# Patient Record
Sex: Female | Born: 1935 | Race: White | Hispanic: No | Smoking: Never smoker
Health system: Southern US, Community
[De-identification: ages and names within clinical notes are randomized; demographics above are authoritative.]

## PROBLEM LIST (undated history)

## (undated) DIAGNOSIS — M1990 Unspecified osteoarthritis, unspecified site: Secondary | ICD-10-CM

## (undated) DIAGNOSIS — F0390 Unspecified dementia without behavioral disturbance: Secondary | ICD-10-CM

## (undated) DIAGNOSIS — I509 Heart failure, unspecified: Secondary | ICD-10-CM

## (undated) DIAGNOSIS — E785 Hyperlipidemia, unspecified: Secondary | ICD-10-CM

## (undated) DIAGNOSIS — I10 Essential (primary) hypertension: Secondary | ICD-10-CM

## (undated) DIAGNOSIS — N189 Chronic kidney disease, unspecified: Secondary | ICD-10-CM

## (undated) DIAGNOSIS — J984 Other disorders of lung: Secondary | ICD-10-CM

## (undated) HISTORY — DX: Other disorders of lung: J98.4

## (undated) HISTORY — PX: PACEMAKER INSERTION: SHX728

## (undated) HISTORY — DX: Chronic kidney disease, unspecified: N18.9

## (undated) HISTORY — PX: BREAST SURGERY: SHX581

## (undated) HISTORY — DX: Essential (primary) hypertension: I10

## (undated) HISTORY — DX: Hyperlipidemia, unspecified: E78.5

## (undated) HISTORY — DX: Unspecified dementia, unspecified severity, without behavioral disturbance, psychotic disturbance, mood disturbance, and anxiety: F03.90

## (undated) HISTORY — DX: Heart failure, unspecified: I50.9

## (undated) HISTORY — DX: Unspecified osteoarthritis, unspecified site: M19.90

## (undated) HISTORY — PX: ABDOMINAL HYSTERECTOMY: SHX81

## (undated) HISTORY — PX: CATARACT EXTRACTION: SUR2

---

## 2004-02-24 ENCOUNTER — Other Ambulatory Visit: Payer: Self-pay

## 2004-06-09 ENCOUNTER — Ambulatory Visit: Payer: Self-pay | Admitting: Family Medicine

## 2005-07-21 ENCOUNTER — Ambulatory Visit: Payer: Self-pay | Admitting: Unknown Physician Specialty

## 2005-11-16 ENCOUNTER — Ambulatory Visit: Payer: Self-pay | Admitting: Family Medicine

## 2005-12-01 ENCOUNTER — Ambulatory Visit: Payer: Self-pay | Admitting: Internal Medicine

## 2005-12-15 ENCOUNTER — Ambulatory Visit: Payer: Self-pay | Admitting: Internal Medicine

## 2005-12-25 ENCOUNTER — Ambulatory Visit: Payer: Self-pay | Admitting: Internal Medicine

## 2005-12-27 ENCOUNTER — Inpatient Hospital Stay: Payer: Self-pay | Admitting: Internal Medicine

## 2007-01-09 ENCOUNTER — Ambulatory Visit: Payer: Self-pay | Admitting: Family Medicine

## 2007-05-31 ENCOUNTER — Ambulatory Visit: Payer: Self-pay | Admitting: Internal Medicine

## 2007-06-26 ENCOUNTER — Ambulatory Visit: Payer: Self-pay | Admitting: Internal Medicine

## 2008-01-14 ENCOUNTER — Ambulatory Visit: Payer: Self-pay | Admitting: Internal Medicine

## 2008-08-04 ENCOUNTER — Encounter: Payer: Medicare Other | Admitting: Internal Medicine

## 2008-08-06 ENCOUNTER — Ambulatory Visit: Payer: Medicare Other | Admitting: Internal Medicine

## 2008-08-31 ENCOUNTER — Encounter: Payer: Medicare Other | Admitting: Internal Medicine

## 2008-10-01 ENCOUNTER — Encounter: Payer: Medicare Other | Admitting: Internal Medicine

## 2009-01-29 ENCOUNTER — Inpatient Hospital Stay: Payer: Medicare Other | Admitting: Internal Medicine

## 2009-01-31 HISTORY — PX: OTHER SURGICAL HISTORY: SHX169

## 2009-02-02 ENCOUNTER — Encounter: Payer: Self-pay | Admitting: Internal Medicine

## 2009-02-16 ENCOUNTER — Ambulatory Visit: Payer: Self-pay | Admitting: Internal Medicine

## 2009-02-23 ENCOUNTER — Ambulatory Visit: Payer: Self-pay | Admitting: Internal Medicine

## 2009-02-26 ENCOUNTER — Encounter: Payer: Self-pay | Admitting: Internal Medicine

## 2009-03-09 ENCOUNTER — Ambulatory Visit: Payer: Self-pay | Admitting: Internal Medicine

## 2009-03-15 ENCOUNTER — Encounter: Payer: Self-pay | Admitting: Internal Medicine

## 2009-03-29 ENCOUNTER — Encounter: Payer: Self-pay | Admitting: Internal Medicine

## 2009-03-29 DIAGNOSIS — R32 Unspecified urinary incontinence: Secondary | ICD-10-CM

## 2009-03-29 DIAGNOSIS — E1149 Type 2 diabetes mellitus with other diabetic neurological complication: Secondary | ICD-10-CM

## 2009-03-29 DIAGNOSIS — I10 Essential (primary) hypertension: Secondary | ICD-10-CM | POA: Insufficient documentation

## 2009-03-29 DIAGNOSIS — E1140 Type 2 diabetes mellitus with diabetic neuropathy, unspecified: Secondary | ICD-10-CM

## 2009-03-29 DIAGNOSIS — F068 Other specified mental disorders due to known physiological condition: Secondary | ICD-10-CM

## 2009-03-29 DIAGNOSIS — I6350 Cerebral infarction due to unspecified occlusion or stenosis of unspecified cerebral artery: Secondary | ICD-10-CM | POA: Insufficient documentation

## 2009-03-31 ENCOUNTER — Encounter: Payer: Self-pay | Admitting: Internal Medicine

## 2009-04-08 ENCOUNTER — Ambulatory Visit: Payer: Self-pay | Admitting: Internal Medicine

## 2009-04-14 LAB — CONVERTED CEMR LAB
AST: 19 units/L (ref 0–37)
Albumin: 3.7 g/dL (ref 3.5–5.2)
Alkaline Phosphatase: 82 units/L (ref 39–117)
BUN: 17 mg/dL (ref 6–23)
Basophils Absolute: 0 10*3/uL (ref 0.0–0.1)
Bilirubin, Direct: 0.1 mg/dL (ref 0.0–0.3)
Eosinophils Absolute: 0.1 10*3/uL (ref 0.0–0.7)
GFR calc non Af Amer: 38.1 mL/min (ref >60)
Glucose, Bld: 107 mg/dL — ABNORMAL HIGH (ref 70–99)
Hemoglobin: 13.6 g/dL (ref 12.0–15.0)
Lymphocytes Relative: 17.5 % (ref 12.0–46.0)
MCHC: 34.2 g/dL (ref 30.0–36.0)
Monocytes Relative: 5.8 % (ref 3.0–12.0)
Neutro Abs: 5.1 10*3/uL (ref 1.4–7.7)
Neutrophils Relative %: 74.5 % (ref 43.0–77.0)
Phosphorus: 3.1 mg/dL (ref 2.3–4.6)
RBC: 4.15 M/uL (ref 3.87–5.11)
RDW: 12.8 % (ref 11.5–14.6)
Sodium: 142 meq/L (ref 135–145)
TSH: 2.02 microintl units/mL (ref 0.35–5.50)

## 2009-05-21 ENCOUNTER — Encounter: Payer: Self-pay | Admitting: Internal Medicine

## 2009-07-14 ENCOUNTER — Ambulatory Visit: Payer: Self-pay | Admitting: Internal Medicine

## 2009-07-14 DIAGNOSIS — E785 Hyperlipidemia, unspecified: Secondary | ICD-10-CM

## 2009-07-15 LAB — CONVERTED CEMR LAB
BUN: 20 mg/dL (ref 6–23)
Basophils Relative: 0.1 % (ref 0.0–3.0)
Bilirubin, Direct: 0.1 mg/dL (ref 0.0–0.3)
CO2: 31 meq/L (ref 19–32)
Calcium: 10.2 mg/dL (ref 8.4–10.5)
Chloride: 104 meq/L (ref 96–112)
Direct LDL: 68.1 mg/dL
Eosinophils Absolute: 0.1 10*3/uL (ref 0.0–0.7)
HCT: 39.9 % (ref 36.0–46.0)
HDL: 54.9 mg/dL (ref >39.00)
Hemoglobin: 13.5 g/dL (ref 12.0–15.0)
Lymphocytes Relative: 16.5 % (ref 12.0–46.0)
Lymphs Abs: 1.3 10*3/uL (ref 0.7–4.0)
MCHC: 33.7 g/dL (ref 30.0–36.0)
MCV: 95 fL (ref 78.0–100.0)
Neutro Abs: 5.8 10*3/uL (ref 1.4–7.7)
Phosphorus: 2.7 mg/dL (ref 2.3–4.6)
Potassium: 4.8 meq/L (ref 3.5–5.1)
RBC: 4.2 M/uL (ref 3.87–5.11)
RDW: 13 % (ref 11.5–14.6)
Sodium: 140 meq/L (ref 135–145)
Total Bilirubin: 0.8 mg/dL (ref 0.3–1.2)
Total CHOL/HDL Ratio: 3
Triglycerides: 246 mg/dL — ABNORMAL HIGH (ref 0.0–149.0)
VLDL: 49.2 mg/dL — ABNORMAL HIGH (ref 0.0–40.0)

## 2009-09-17 ENCOUNTER — Telehealth: Payer: Self-pay | Admitting: Internal Medicine

## 2009-09-17 ENCOUNTER — Ambulatory Visit: Payer: Self-pay | Admitting: Internal Medicine

## 2009-09-17 DIAGNOSIS — M549 Dorsalgia, unspecified: Secondary | ICD-10-CM | POA: Insufficient documentation

## 2009-09-17 LAB — CONVERTED CEMR LAB
RBC / HPF: 0
Urobilinogen, UA: 0.2

## 2009-09-28 ENCOUNTER — Ambulatory Visit: Payer: Medicare Other | Admitting: Ophthalmology

## 2009-09-28 ENCOUNTER — Encounter: Payer: Self-pay | Admitting: Internal Medicine

## 2009-10-05 LAB — HM DIABETES EYE EXAM

## 2009-10-12 ENCOUNTER — Ambulatory Visit: Payer: Medicare Other | Admitting: Ophthalmology

## 2009-10-12 ENCOUNTER — Ambulatory Visit: Payer: Self-pay | Admitting: Internal Medicine

## 2009-10-15 ENCOUNTER — Ambulatory Visit: Payer: Self-pay | Admitting: Internal Medicine

## 2009-10-22 ENCOUNTER — Emergency Department: Payer: Medicare Other | Admitting: Internal Medicine

## 2009-10-26 ENCOUNTER — Ambulatory Visit: Payer: Self-pay | Admitting: Internal Medicine

## 2009-10-26 ENCOUNTER — Encounter: Payer: Self-pay | Admitting: Internal Medicine

## 2009-10-26 ENCOUNTER — Inpatient Hospital Stay: Payer: Medicare Other | Admitting: Internal Medicine

## 2009-10-29 ENCOUNTER — Ambulatory Visit: Payer: Self-pay | Admitting: Internal Medicine

## 2009-11-16 ENCOUNTER — Ambulatory Visit: Payer: Self-pay | Admitting: Internal Medicine

## 2009-12-30 ENCOUNTER — Ambulatory Visit: Payer: Self-pay | Admitting: Internal Medicine

## 2010-01-10 ENCOUNTER — Ambulatory Visit: Payer: Self-pay | Admitting: Internal Medicine

## 2010-02-16 ENCOUNTER — Ambulatory Visit: Payer: Self-pay | Admitting: Family Medicine

## 2010-03-03 ENCOUNTER — Ambulatory Visit: Payer: Medicare Other | Admitting: Internal Medicine

## 2010-03-24 ENCOUNTER — Encounter: Payer: Self-pay | Admitting: Internal Medicine

## 2010-03-24 ENCOUNTER — Ambulatory Visit: Payer: Self-pay | Admitting: Internal Medicine

## 2010-03-24 DIAGNOSIS — M1990 Unspecified osteoarthritis, unspecified site: Secondary | ICD-10-CM | POA: Insufficient documentation

## 2010-03-27 ENCOUNTER — Inpatient Hospital Stay: Payer: Medicare Other | Admitting: Internal Medicine

## 2010-03-28 ENCOUNTER — Telehealth: Payer: Self-pay | Admitting: Internal Medicine

## 2010-03-31 ENCOUNTER — Encounter: Payer: Self-pay | Admitting: Internal Medicine

## 2010-04-02 ENCOUNTER — Ambulatory Visit: Payer: Medicare Other | Admitting: Internal Medicine

## 2010-04-13 ENCOUNTER — Encounter: Payer: Self-pay | Admitting: Internal Medicine

## 2010-04-26 ENCOUNTER — Encounter: Payer: Self-pay | Admitting: Internal Medicine

## 2010-05-06 ENCOUNTER — Telehealth: Payer: Self-pay | Admitting: Internal Medicine

## 2010-05-23 ENCOUNTER — Encounter: Payer: Self-pay | Admitting: Internal Medicine

## 2010-06-16 ENCOUNTER — Encounter: Payer: Self-pay | Admitting: Internal Medicine

## 2010-06-16 DIAGNOSIS — I5022 Chronic systolic (congestive) heart failure: Secondary | ICD-10-CM

## 2010-08-02 NOTE — Progress Notes (Signed)
Summary: husband wants pt's urine checked  Phone Note Call from Patient   Caller: Spouse Call For: Cindee Salt MD Summary of Call: Pt's husband called to report that pt wants to sleep all the time and she complains of a back ache.  He thinks she might have a UTI and he is asking that we send an order to [facility] to check the pt's urine.  Please advise.  (Mandy's number is [phone]).  Pt has an appt here mid april. Initial call taken by: Lowella Petties CMA,  September 17, 2009 11:56 AM  Follow-up for Phone Call        I don't just check urine That doesn't help for current situation and I treat patients, not urine specimiens If she is sick, should be seen  can add on at 2:45 if he can bring her in Follow-up by: Cindee Salt MD,  September 17, 2009 1:46 PM  Additional Follow-up for Phone Call Additional follow up Details #1::        spoke with husband and pt will be here at 2:45 Additional Follow-up by: Mervin Hack CMA Duncan Dull),  September 17, 2009 2:15 PM

## 2010-08-02 NOTE — Assessment & Plan Note (Signed)
Summary: FOLLOW UP / LFW   Vital Signs:  Patient profile:   77 year old female Weight:      176 pounds Temp:     98.2 degrees F oral Pulse rate:   88 / minute Pulse rhythm:   regular BP sitting:   136 / 70  (left arm) Cuff size:   regular  Vitals Entered By: Mervin Hack CMA Duncan Dull) (July 14, 2009 11:10 AM) CC: 3 month follow-up   History of Present Illness: Here with husband  Doing fairly well Still walking pretty well Uses cane No problems getting out of chair or bed Done with PT No recent falls  still with memory problems Husband doesn't note any progression  Husband has been giving a full glipizide daily Only checks occ No low sugar reactions Feet remain numb and with some pain No numbness  No chest pain  No SOB Husband occ checks BP--"good" but he doesn't remember the numbers (130 systolic though he thinks)  Allergies: No Known Drug Allergies  Past History:  Past medical, surgical, family and social histories (including risk factors) reviewed for relevance to current acute and chronic problems.  Past Medical History: Dementia Diabetes mellitus, type II Hypertension Urinary incontinence Hyperlipidemia  Past Surgical History: Reviewed history from 03/29/2009 and no changes required. Pacemaker  ~2008 Hysterectomy Breast Biopsy - negative Cataract X 1 UTI/GI Problems Hospital 08/10  Family History: Reviewed history from 03/29/2009 and no changes required. Father: Died in his 50's ?? Mother:Died at 65, old age Siblings: One sister living 56 aunts/uncles lived into their 53's  Social History: Reviewed history from 03/29/2009 and no changes required. Marital Status: Married Children: 2 children Occupation: Retired Runner, broadcasting/film/video Never smoked ETOH- rare in the past  Review of Systems       sleeps okay as long as she takes the amitriptylline appetite is fine weight fairly stable  Physical Exam  General:  alert.  NAD Neck:  supple,  no masses, no thyromegaly, no carotid bruits, and no cervical lymphadenopathy.   Lungs:  normal respiratory effort and normal breath sounds.   Heart:  normal rate, regular rhythm, no murmur, and no gallop.   Pulses:  1+ in feet Extremities:  no edema Neurologic:  mild memory problems (forgot who I was when they were coming here till she saw the office) Gait slow but steady with cane Skin:  no suspicious lesions and no ulcerations.   Psych:  normally interactive, good eye contact, not anxious appearing, and not depressed appearing.    Diabetes Management Exam:    Foot Exam (with socks and/or shoes not present):       Sensory-Pinprick/Light touch:          Left medial foot (L-4): diminished          Left dorsal foot (L-5): diminished          Left lateral foot (S-1): diminished          Right medial foot (L-4): diminished          Right dorsal foot (L-5): diminished          Right lateral foot (S-1): diminished       Inspection:          Left foot: normal          Right foot: normal       Nails:          Left foot: thickened          Right  foot: thickened   Impression & Recommendations:  Problem # 1:  DEMENTIA (ICD-294.8) Assessment Unchanged stable Not clearly Altzheimers type will continue aricept  Problem # 2:  DIABETES MELLITUS, TYPE II (ICD-250.00) Assessment: Unchanged  control very good will have husband go back to 1/2 a day due to concerns for hypoglycemic risk  Her updated medication list for this problem includes:    Aspirin Ec 81 Mg Tbec (Aspirin) .Marland Kitchen... Take one by mouth daily    Glipizide 5 Mg Tabs (Glipizide) .Marland Kitchen... Take 1/2 tablet by mouth daily  Labs Reviewed: Creat: 1.4 (04/08/2009)    Reviewed HgBA1c results: 6.3 (04/08/2009)  Orders: TLB-A1C / Hgb A1C (Glycohemoglobin) (83036-A1C)  Problem # 3:  HYPERTENSION (ICD-401.9) Assessment: Unchanged  good control no changes needed  Her updated medication list for this problem includes:    Verapamil  Hcl Cr 240 Mg Cr-tabs (Verapamil hcl) .Marland Kitchen... Take 1 tablet by mouth once daily  BP today: 136/70 Prior BP: 150/80 (04/08/2009)  Labs Reviewed: K+: 4.6 (04/08/2009) Creat: : 1.4 (04/08/2009)     Orders: TLB-Renal Function Panel (80069-RENAL) TLB-CBC Platelet - w/Differential (85025-CBCD) TLB-TSH (Thyroid Stimulating Hormone) (84443-TSH) Venipuncture (16109)  Problem # 4:  HYPERLIPIDEMIA (ICD-272.4) Assessment: Comment Only  back on simvastatin per Dr Lady Gary will check labs No cognitive changes since restarting  Labs Reviewed: SGOT: 19 (04/08/2009)   SGPT: 14 (04/08/2009)  Her updated medication list for this problem includes:    Simvastatin 20 Mg Tabs (Simvastatin) .Marland Kitchen... 1 tab daily for high blood pressure  Orders: TLB-Lipid Panel (80061-LIPID) TLB-Hepatic/Liver Function Pnl (80076-HEPATIC)  Problem # 5:  URINARY INCONTINENCE (ICD-788.30) Assessment: Comment Only recommended scheduled toileting I don't recommend meds  Problem # 6:  NEUROPATHY (ICD-355.9) Assessment: Comment Only walking better now  Complete Medication List: 1)  Gabapentin 300 Mg Caps (Gabapentin) .... Take one capsule every am and 2 at bedtime 2)  Aspirin Ec 81 Mg Tbec (Aspirin) .... Take one by mouth daily 3)  Miralax Powd (Polyethylene glycol 3350) .Marland Kitchen.. 1 bottlecapful, or 1 heaping tablespoon in water/juice daily 4)  Glipizide 5 Mg Tabs (Glipizide) .... Take 1/2 tablet by mouth daily 5)  Verapamil Hcl Cr 240 Mg Cr-tabs (Verapamil hcl) .... Take 1 tablet by mouth once daily 6)  Aricept 10 Mg Tabs (Donepezil hcl) .... Take one by mouth daily 7)  Amitriptyline Hcl 25 Mg Tabs (Amitriptyline hcl) .... Take one tablet by mouth at bedtime 8)  Simvastatin 20 Mg Tabs (Simvastatin) .Marland Kitchen.. 1 tab daily for high blood pressure  Patient Instructions: 1)  Please schedule a follow-up appointment in 4 months .  Prescriptions: SIMVASTATIN 20 MG TABS (SIMVASTATIN) 1 tab daily for high blood pressure  #90 x 3   Entered  and Authorized by:   Cindee Salt MD   Signed by:   Cindee Salt MD on 07/14/2009   Method used:   Electronically to        [facility]* (mail-order)             ,          Ph: [phone]       Fax: [phone]   RxID:   [id number]   Current Allergies (reviewed today): No known allergies

## 2010-08-02 NOTE — Consult Note (Signed)
Summary: [facility]  [facility]   Imported By: Lanelle Bal 07/05/2009 10:47:18  _____________________________________________________________________  External Attachment:    Type:   Image     Comment:   External Document

## 2010-08-02 NOTE — Assessment & Plan Note (Signed)
Summary: 4 m f/u dlo   Vital Signs:  Patient profile:   77 year old female Weight:      177 pounds Temp:     98.0 degrees F oral Pulse rate:   84 / minute Pulse rhythm:   regular BP sitting:   118 / 70  (left arm) Cuff size:   regular  Vitals Entered By: Mervin Hack CMA Duncan Dull) (October 15, 2009 8:58 AM) CC: 4 month follow-up   History of Present Illness: Had left cataract taken off last Tuesday doing well from the surgery  Awakening at night with "wild dreams" per husband thinks people are in the house settles down after a while she isn't aware of it Also awoke one night and told husband she had to plan party for baby-but didn't know anything else She notes no sleep problems in general but she does sleep a lot in day and night  Not checking sugars very often No apparent hypoglycemic reactions  Foot pain continues no sig change  Still with urinary incontinence wears pad  memory seems about the same per husband   Allergies: No Known Drug Allergies  Past History:  Past medical, surgical, family and social histories (including risk factors) reviewed for relevance to current acute and chronic problems.  Past Medical History: Reviewed history from 07/14/2009 and no changes required. Dementia Diabetes mellitus, type II Hypertension Urinary incontinence Hyperlipidemia  Past Surgical History: Pacemaker  ~2008 Hysterectomy Breast Biopsy - negative Cataract X 1 UTI/GI Problems Hospital 08/10 Cataract left eye-- 4/11  Family History: Reviewed history from 03/29/2009 and no changes required. Father: Died in his 75's ?? Mother:Died at 7, old age Siblings: One sister living 61 aunts/uncles lived into their 29's  Social History: Reviewed history from 03/29/2009 and no changes required. 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     Neck:  supple, no masses, no thyromegaly, no carotid bruits, and no cervical lymphadenopathy.   Lungs:  normal respiratory effort and normal breath sounds.   Heart:  normal rate, regular rhythm, no murmur, and no gallop.   Pulses:  2+ in feet Extremities:  no edema Neurologic:  mild memory deficits but appropriate engagement Skin:  no rashes, no suspicious lesions, and no ulcerations.   Psych:  normally interactive, good eye contact, not anxious appearing, and not depressed appearing.    Diabetes Management Exam:    Foot Exam (with socks and/or shoes not present):       Sensory-Pinprick/Light touch:          Left medial foot (L-4): diminished          Left dorsal foot (L-5): diminished          Left lateral foot (S-1): diminished          Right medial foot (L-4): diminished          Right dorsal foot (L-5): diminished          Right lateral foot (S-1): diminished       Inspection:          Left foot: abnormal             Comments: mild drying          Right foot: abnormal             Comments: mild drying       Nails:  Left foot: thickened          Right foot: thickened    Eye Exam:       Eye Exam done elsewhere          Date: 10/05/2009          Results: no retinopathy          Done by: Dr Druscilla Brownie   Impression & Recommendations:  Problem # 1:  NEUROPATHY (ICD-355.9) Assessment Unchanged  continues not clear if gabapentin could be related to sleep issues sedated during day will switch all gabapentin to bedtime---adjust if awakening isn't improved  Problem # 2:  DEMENTIA (ICD-294.8) Assessment: Unchanged will consider decreasing donepezil if sleep disturbance continues  Problem # 3:  DIABETES MELLITUS, TYPE II (ICD-250.00) Assessment: Unchanged  recheck control on 1/2 tab  Her updated medication list for this problem includes:    Glipizide 5 Mg Tabs (Glipizide) .Marland Kitchen... Take 1/2 tablet by mouth daily    Aspirin Ec 81 Mg Tbec (Aspirin) .Marland Kitchen... Take one by  mouth daily  Labs Reviewed: Creat: 1.3 (07/14/2009)     Last Eye Exam: no retinopathy (10/05/2009) Reviewed HgBA1c results: 7.1 (07/14/2009)  6.3 (04/08/2009)  Orders: Venipuncture (04540) TLB-A1C / Hgb A1C (Glycohemoglobin) (83036-A1C)  Problem # 4:  HYPERTENSION (ICD-401.9) Assessment: Unchanged good control no changes needed  Her updated medication list for this problem includes:    Verapamil Hcl Cr 240 Mg Cr-tabs (Verapamil hcl) .Marland Kitchen... Take 1 tablet by mouth once daily  BP today: 118/70 Prior BP: 140/80 (09/17/2009)  Labs Reviewed: K+: 4.8 (07/14/2009) Creat: : 1.3 (07/14/2009)   Chol: 145 (07/14/2009)   HDL: 54.90 (07/14/2009)   TG: 246.0 (07/14/2009)  Problem # 5:  HYPERLIPIDEMIA (ICD-272.4) Assessment: Unchanged good control  Her updated medication list for this problem includes:    Simvastatin 20 Mg Tabs (Simvastatin) .Marland Kitchen... 1 tab daily for high blood pressure  Labs Reviewed: SGOT: 18 (07/14/2009)   SGPT: 14 (07/14/2009)   HDL:54.90 (07/14/2009)  Chol:145 (07/14/2009)  Trig:246.0 (07/14/2009)  Problem # 6:  URINARY INCONTINENCE (ICD-788.30) Assessment: Unchanged continues just uses pads  Complete Medication List: 1)  Gabapentin 300 Mg Caps (Gabapentin) .... Take three caps at bedtime to help nerve pain in feet 2)  Glipizide 5 Mg Tabs (Glipizide) .... Take 1/2 tablet by mouth daily 3)  Verapamil Hcl Cr 240 Mg Cr-tabs (Verapamil hcl) .... Take 1 tablet by mouth once daily 4)  Aricept 10 Mg Tabs (Donepezil hcl) .... Take one by mouth daily 5)  Amitriptyline Hcl 25 Mg Tabs (Amitriptyline hcl) .... Take one tablet by mouth at bedtime 6)  Simvastatin 20 Mg Tabs (Simvastatin) .Marland Kitchen.. 1 tab daily for high blood pressure 7)  Aspirin Ec 81 Mg Tbec (Aspirin) .... Take one by mouth daily 8)  Tylenol Arthritis Pain 650 Mg Cr-tabs (Acetaminophen) .Marland Kitchen.. 1 tab by mouth three times a day after meals for back pain 9)  Miralax Powd (Polyethylene glycol 3350) .Marland Kitchen.. 1 bottlecapful,  or 1 heaping tablespoon in water/juice daily  Patient Instructions: 1)  Give all 3 gabapentin at night. Please call if the sleep problems don't get better 2)  Please schedule a follow-up appointment in 4 months .   Current Allergies (reviewed today): No known allergies

## 2010-08-02 NOTE — Letter (Signed)
Summary: [facility]   Imported By: Lanelle Bal 04/07/2010 12:21:52  _____________________________________________________________________  External Attachment:    Type:   Image     Comment:   External Document

## 2010-08-02 NOTE — Assessment & Plan Note (Signed)
Summary: [facility]   Vital Signs:  Patient profile:   77 year old female Weight:      163 pounds Temp:     98.1 degrees F Pulse rate:   80 / minute Resp:     18 per minute BP sitting:   128 / 74 CC: Assisted living folllow up   History of Present Illness: Reviewed status with [place] RN She has adjusted well here  Had gone to health care for respite when husband sick He joined her but his status deteriorated and then he died She went to Memory Care  but did not like it there  Despite her dementia, she does not pose a elopement risk She voices no urge to leave unless family comes to take her out Has been here in assisted lving for about 1 month  she enjoys the food and is eating well sleeps fair--does have some restless nights Still some grieving for husband---no persistent problems though  Walks independently with rollator Notes pain when getting dressed--- legs and back Lidoderm seems to help back but she has a hard time telling  Mild edema No SOB No chest pain  sugars have been fine will have them check here regularly no hypoglycemia  Allergies: No Known Drug Allergies  Past History:  Past medical, surgical, family and social histories (including risk factors) reviewed, and no changes noted (except as noted below).  Past Medical History: Dementia Diabetes mellitus, type II Hypertension Urinary incontinence Hyperlipidemia Osteoarthritis  Past Surgical History: Reviewed history from 10/15/2009 and no changes required. Pacemaker  ~2008 Hysterectomy Breast Biopsy - negative Cataract X 1 UTI/GI Problems Hospital 08/10 Cataract left eye-- 4/11  Family History: Reviewed history from 03/29/2009 and no changes required. Father: Died in his 85's ?? Mother:Died at 42, old age Siblings: One sister living 38 aunts/uncles lived into their 64's  Social History: Widowed 2011 Children: 2 children Occupation: Retired Runner, broadcasting/film/video Never  smoked ETOH- rare in the past  Review of Systems       Weight is down 14# since April--much of this was grieving and change of status Now eating better No stomach pain Bowels okay Occ urinary incontinence--wears pad  Physical Exam  General:  alert and normal appearance.   Neck:  supple, no masses, no thyromegaly, no carotid bruits, and no cervical lymphadenopathy.   Lungs:  normal respiratory effort, no intercostal retractions, no accessory muscle use, and normal breath sounds.   Heart:  normal rate, regular rhythm, no murmur, and no gallop.   Abdomen:  soft and non-tender.   Msk:  no joint tenderness and no joint swelling.   Pulses:  faint in each foot Extremities:  no edema Psych:  normally interactive, not anxious appearing, not depressed appearing, and flat affect.    Diabetes Management Exam:    Foot Exam (with socks and/or shoes not present):       Sensory-Pinprick/Light touch:          Left medial foot (L-4): diminished          Left dorsal foot (L-5): diminished          Left lateral foot (S-1): diminished          Right medial foot (L-4): diminished          Right dorsal foot (L-5): diminished          Right lateral foot (S-1): diminished       Inspection:  Left foot: normal          Right foot: normal       Nails:          Left foot: thickened          Right foot: thickened   Impression & Recommendations:  Problem # 1:  GRIEF REACTION (ICD-309.0) Assessment Improved seems to be doing better here still misses husband but adjusting to her new life  Problem # 2:  DIABETES MELLITUS, TYPE II (ICD-250.00) Assessment: Unchanged has had good control will check sugar weekly and do routine 6 month labs in November  Her updated medication list for this problem includes:    Glipizide 5 Mg Tabs (Glipizide) .Marland Kitchen... Take 1/2 tablet by mouth daily    Aspirin Ec 81 Mg Tbec (Aspirin) .Marland Kitchen... Take one by mouth daily  Labs Reviewed: Creat: 1.3 (07/14/2009)      Last Eye Exam: no retinopathy (10/05/2009) Reviewed HgBA1c results: 7.2 (10/15/2009)  7.1 (07/14/2009)  Problem # 3:  NEUROPATHY (ICD-355.9) Assessment: Unchanged doing okay on current gabapentin  Problem # 4:  OSTEOARTHRITIS (ICD-715.90) Assessment: Comment Only stiff in the morning but loosens up doing okay with current regimen  Her updated medication list for this problem includes:    Aspirin Ec 81 Mg Tbec (Aspirin) .Marland Kitchen... Take one by mouth daily    Tylenol Arthritis Pain 650 Mg Cr-tabs (Acetaminophen) .Marland Kitchen... 1 tab by mouth three times a day after meals for back pain    Tramadol Hcl 50 Mg Tabs (Tramadol hcl) .Marland Kitchen... Take 1/2 to 1 tab by mouth every 4 hours as needed for pain  Problem # 5:  DEMENTIA (ICD-294.8) Assessment: Unchanged stable cognitive status on aricept  Problem # 6:  HYPERLIPIDEMIA (ICD-272.4) Assessment: Unchanged will continue given history of CVA and DM  The following medications were removed from the medication list:    Simvastatin 20 Mg Tabs (Simvastatin) .Marland Kitchen... 1 tab daily for high blood pressure  Complete Medication List: 1)  Gabapentin 300 Mg Caps (Gabapentin) .... Take 1 tab in the morning and 2 at bedtime 2)  Glipizide 5 Mg Tabs (Glipizide) .... Take 1/2 tablet by mouth daily 3)  Verapamil Hcl Cr 240 Mg Cr-tabs (Verapamil hcl) .... Take 1 tablet by mouth once daily 4)  Aricept 10 Mg Tabs (Donepezil hcl) .... Take one by mouth daily 5)  Amitriptyline Hcl 25 Mg Tabs (Amitriptyline hcl) .... Take one tablet by mouth at bedtime 6)  Aspirin Ec 81 Mg Tbec (Aspirin) .... Take one by mouth daily 7)  Tylenol Arthritis Pain 650 Mg Cr-tabs (Acetaminophen) .Marland Kitchen.. 1 tab by mouth three times a day after meals for back pain 8)  Miralax Powd (Polyethylene glycol 3350) .Marland Kitchen.. 1 bottlecapful, or 1 heaping tablespoon in water/juice daily 9)  Lidoderm 5 % Ptch (Lidocaine) .... Apply daily for 12 hours for pain 10)  Tramadol Hcl 50 Mg Tabs (Tramadol hcl) .... Take 1/2 to 1 tab  by mouth every 4 hours as needed for pain  Patient Instructions: 1)  Will plan follow up in 2-3 months

## 2010-08-02 NOTE — Letter (Signed)
Summary: [facility]  [facility]   Imported By: Maryln Gottron 04/21/2010 15:11:28  _____________________________________________________________________  External Attachment:    Type:   Image     Comment:   External Document  Appended Document: [facility] stable CHF and pacer check done

## 2010-08-02 NOTE — Letter (Signed)
Summary: [facility]   Imported By: Lanelle Bal 11/09/2009 11:52:33  _____________________________________________________________________  External Attachment:    Type:   Image     Comment:   External Document

## 2010-08-02 NOTE — Progress Notes (Signed)
Summary: Med Order  Med Order   Imported By: Lanelle Bal 05/03/2010 13:43:14  _____________________________________________________________________  External Attachment:    Type:   Image     Comment:   External Document

## 2010-08-02 NOTE — Assessment & Plan Note (Signed)
Summary: 2:45/ UTI/ds   Vital Signs:  Patient profile:   77 year old female Weight:      177 pounds Temp:     98.2 degrees F oral BP sitting:   140 / 80  (left arm) Cuff size:   regular  Vitals Entered By: Mervin Hack CMA Duncan Dull) (September 17, 2009 2:51 PM) CC: UTI   History of Present Illness: Having some increased frequency Mostly in daytime same nocturia x 1 No pain No blood No fever No N/V  Gets back pain after eating and needs to lie down--per husband she finds it is not just eating related okay with sitting but gets bad with any activity or walking better with rest in bed Hasn't even tried tylenol  Allergies: No Known Drug Allergies  Past History:  Past medical, surgical, family and social histories (including risk factors) reviewed for relevance to current acute and chronic problems.  Past Medical History: Reviewed history from 07/14/2009 and no changes required. Dementia Diabetes mellitus, type II Hypertension Urinary incontinence Hyperlipidemia  Past Surgical History: Reviewed history from 03/29/2009 and no changes required. Pacemaker  ~2008 Hysterectomy Breast Biopsy - negative Cataract X 1 UTI/GI Problems Hospital 08/10  Family History: Reviewed history from 03/29/2009 and no changes required. Father: Died in his 82's ?? Mother:Died at 61, old age Siblings: One sister living 46 aunts/uncles lived into their 50's  Social History: Reviewed history from 03/29/2009 and no changes required. Marital Status: Married Children: 2 children Occupation: Retired Runner, broadcasting/film/video Never smoked ETOH- rare in the past  Review of Systems       Eating well Sleeps okay but more dreams. Awakens haven heard her mother or sister in the house Memory seems worse  Physical Exam  General:  alert.  NAD Abdomen:  soft, non-tender, and no masses.   Msk:  no spine or CVA tenderness mild tightness along trapezius and down thoracic paraspinals   Impression &  Recommendations:  Problem # 1:  ACUTE CYSTITIS (ICD-595.0) Assessment New  not clear cut urine is contaminated  will give Rx to take only if she has dysuria  Orders: UA Dipstick W/ Micro (manual) ([address])  Her updated medication list for this problem includes:    Amoxicillin 500 Mg Tabs (Amoxicillin) .Marland Kitchen... Take 1 tab by mouth two times a day for pain when urinating  Problem # 2:  BACK PAIN (ICD-724.5) Assessment: Deteriorated will try tylenol regularly consider increased meds if not effective  Complete Medication List: 1)  Gabapentin 300 Mg Caps (Gabapentin) .... Take one capsule every am and 2 at bedtime 2)  Aspirin Ec 81 Mg Tbec (Aspirin) .... Take one by mouth daily 3)  Miralax Powd (Polyethylene glycol 3350) .Marland Kitchen.. 1 bottlecapful, or 1 heaping tablespoon in water/juice daily 4)  Glipizide 5 Mg Tabs (Glipizide) .... Take 1/2 tablet by mouth daily 5)  Verapamil Hcl Cr 240 Mg Cr-tabs (Verapamil hcl) .... Take 1 tablet by mouth once daily 6)  Aricept 10 Mg Tabs (Donepezil hcl) .... Take one by mouth daily 7)  Amitriptyline Hcl 25 Mg Tabs (Amitriptyline hcl) .... Take one tablet by mouth at bedtime 8)  Simvastatin 20 Mg Tabs (Simvastatin) .Marland Kitchen.. 1 tab daily for high blood pressure 9)  Tylenol Arthritis Pain 650 Mg Cr-tabs (Acetaminophen) .Marland Kitchen.. 1 tab by mouth three times a day after meals for back pain 10)  Amoxicillin 500 Mg Tabs (Amoxicillin) .... Take 1 tab by mouth two times a day for pain when urinating  Patient Instructions: 1)  Please  start tylenol arthritis (extended release acetominophen) 650mg  three times a day for back pain. 2)  Start the amoxicillin antibiotic if it becomes painful during urination 3)  Please keep your regular follow up Prescriptions: AMOXICILLIN 500 MG TABS (AMOXICILLIN) Take 1 tab by mouth two times a day for pain when urinating  #6 x 2   Entered and Authorized by:   Cindee Salt MD   Signed by:   Cindee Salt MD on 09/17/2009   Method used:    Print then Give to Patient   RxID:   [id number]   Current Allergies (reviewed today): No known allergies   Laboratory Results   Urine Tests  Date/Time Received: September 17, 2009 2:56 PM Date/Time Reported: September 17, 2009 2:56 PM  Routine Urinalysis   Color: orange Appearance: Cloudy Glucose: negative   (Normal Range: Negative) Bilirubin: small   (Normal Range: Negative) Ketone: trace (5)   (Normal Range: Negative) Spec. Gravity: 1.020   (Normal Range: 1.003-1.035) Blood: trace-intact   (Normal Range: Negative) pH: 7.0   (Normal Range: 5.0-8.0) Protein: trace   (Normal Range: Negative) Urobilinogen: 0.2   (Normal Range: 0-1) Nitrite: negative   (Normal Range: Negative) Leukocyte Esterace: moderate   (Normal Range: Negative)  Urine Microscopic WBC/HPF: 1-5 RBC/HPF: 0 Bacteria/HPF: occ Epithelial/HPF: packed

## 2010-08-02 NOTE — Consult Note (Signed)
[facility]   Im[facility] By: Harlon Flor 10/27/2009 09:33:15  _____________________________________________________________________  External Attachment:    Type:   Image     Comment:   External Document  Appended Document: [facility]    Phone Note Outgoing Call   Call placed by: Cindee Salt MD,  October 27, 2009 2:06 PM Summary of Call: spoke to husband Had ER eval on Friday but sent home Then admitted with weakness and "presyncope" 2 days ago Doing some better now Probably will go home soon May need rehab admit Initial call taken by: Cindee Salt MD,  October 27, 2009 2:07 PM  Follow-up for Phone Call        spoke to husband Slow improvement--not quite ready for discharge as yet ??low oximetry being evaluated someone seeing her for her back? Ying Blankenhorn Dia Crawford MD  October 28, 2009 1:26 PM   Additional Follow-up for Phone Call Additional follow up Details #1::        being discharged and going to rehab today i will see her there Additional Follow-up by: Cindee Salt MD,  October 29, 2009 12:20 PM

## 2010-08-02 NOTE — Progress Notes (Signed)
  Phone Note Outgoing Call   Call placed by: Cindee Salt MD,  March 28, 2010 5:08 PM Summary of Call: admitted to [facility] Reportedly for CHF  she is confused (as expected) and doesn't really know where are why she is there sounds okay though Initial call taken by: Cindee Salt MD,  March 28, 2010 5:10 PM  Follow-up for Phone Call        spoke to nurse doing okay plan is for discharge and transfer to Health care tomorrow  will follow with her then Follow-up by: Cindee Salt MD,  March 30, 2010 1:57 PM

## 2010-08-02 NOTE — Progress Notes (Signed)
Summary: refill request for amitriptyline  Phone Note Refill Request Message from:  Fax from Pharmacy  Refills Requested: Medication #1:  AMITRIPTYLINE HCL 25 MG TABS Take one tablet by mouth at bedtime   Last Refilled: 03/08/2010 Faxed request from pharmacare is on your desk.  Initial call taken by: Lowella Petties CMA, AAMA,  May 06, 2010 3:28 PM  Follow-up for Phone Call        okay x 1 year Follow-up by: Cindee Salt MD,  May 09, 2010 10:05 AM  Additional Follow-up for Phone Call Additional follow up Details #1::        Rx faxed to pharmacy Additional Follow-up by: DeShannon Smith CMA Duncan Dull),  May 09, 2010 11:59 AM    Prescriptions: AMITRIPTYLINE HCL 25 MG TABS (AMITRIPTYLINE HCL) Take one tablet by mouth at bedtime  #90 x 3   Entered by:   Mervin Hack CMA (AAMA)   Authorized by:   Cindee Salt MD   Signed by:   Mervin Hack CMA (AAMA) on 05/09/2010   Method used:   Faxed to ...       [facility] (mail-order)       [address]       [place], [place]  [address]       Ph: [phone]       Fax: [phone]   RxID:   [id number]

## 2010-08-04 NOTE — Assessment & Plan Note (Signed)
Summary: [facility] assisted [facility]   Vital Signs:  Patient profile:   77 year old female Weight:      162 pounds Temp:     97.2 degrees F Pulse rate:   78 / minute Resp:     16 per minute BP sitting:   102 / 70  History of Present Illness: Doing well again No new concerns from [facility] RN  admitted for CHF--then health care Now back in assisted living and has been okay  She is concerned about her fingernails turning under no thickening or pain she is concerned about the appearance  awakens  ~3-5 AM----will have pain across entire lower abdomen. happens 2-3 times per week Keeps her up tries to adjust position Gone by the time she is getting up and ready in morning SOme back pain but nothing troubling  No chest pain No SOB some edema---no worse per staff  Allergies: No Known Drug Allergies  Past History:  Past medical, surgical, family and social histories (including risk factors) reviewed for relevance to current acute and chronic problems.  Past Medical History: Dementia Diabetes mellitus, type II Hypertension Urinary incontinence Hyperlipidemia Osteoarthritis Congestive heart failure--systolic  Past Surgical History: Reviewed history from 10/15/2009 and no changes required. Pacemaker  ~2008 Hysterectomy Breast Biopsy - negative Cataract X 1 UTI/GI Problems Hospital 08/10 Cataract left eye-- 4/11  Family History: Reviewed history from 03/29/2009 and no changes required. Father: Died in his 19's ?? Mother:Died at 75, old age Siblings: One sister living 42 aunts/uncles lived into their 88's  Social History: Reviewed history from 03/24/2010 and no changes required. Widowed 2011 Children: 2 children Occupation: Retired Runner, broadcasting/film/video Never smoked ETOH- rare in the past  Review of Systems       Eating fine weight stable sleeps okay till middle of night awakening   Physical Exam  General:  alert and normal appearance.   Neck:  supple, no masses,  no thyromegaly, and no cervical lymphadenopathy.   Lungs:  normal respiratory effort, no intercostal retractions, and no accessory muscle use.  Faint, coarse bibasilar crackles Heart:  normal rate, regular rhythm, no murmur, and no gallop.   Abdomen:  soft, non-tender, and normal bowel sounds.   Neurologic:  normal interaction mild memory deficits repeats herself some Skin:  nails appear normal but curl at ends Nolesions Psych:  normally interactive, good eye contact, not anxious appearing, and not depressed appearing.     Impression & Recommendations:  Problem # 1:  CHRONIC SYSTOLIC HEART FAILURE (ICD-428.22) Assessment Unchanged stable and compensated will check labs  Her updated medication list for this problem includes:    Aspirin Ec 81 Mg Tbec (Aspirin) .Marland Kitchen... Take one by mouth daily    Carvedilol 3.125 Mg Tabs (Carvedilol) .Marland Kitchen... 1 tab by mouth two times a day    Furosemide 40 Mg Tabs (Furosemide) .Marland Kitchen... 1 tab daily  Problem # 2:  DEMENTIA (ICD-294.8) Assessment: Unchanged stable on current therapy doing okay back in assisted living  Problem # 3:  BACK PAIN (ICD-724.5) Assessment: Unchanged likely related to awakening with abd discomfort will have staff offer tramadol if awakens in night  Her updated medication list for this problem includes:    Aspirin Ec 81 Mg Tbec (Aspirin) .Marland Kitchen... Take one by mouth daily    Tylenol Arthritis Pain 650 Mg Cr-tabs (Acetaminophen) .Marland Kitchen... 1 tab by mouth three times a day after meals for back pain    Tramadol Hcl 50 Mg Tabs (Tramadol hcl) .Marland Kitchen... Take 1/2 to 1  tab by mouth every 4 hours as needed for pain  Problem # 4:  HYPERTENSION (ICD-401.9) Assessment: Unchanged low due to appropriate CHF meds  Her updated medication list for this problem includes:    Verapamil Hcl Cr 240 Mg Cr-tabs (Verapamil hcl) .Marland Kitchen... Take 1 tablet by mouth once daily    Carvedilol 3.125 Mg Tabs (Carvedilol) .Marland Kitchen... 1 tab by mouth two times a day    Furosemide 40 Mg  Tabs (Furosemide) .Marland Kitchen... 1 tab daily  BP today: 102/70 Prior BP: 128/74 (03/24/2010)  Labs Reviewed: K+: 4.8 (07/14/2009) Creat: : 1.3 (07/14/2009)   Chol: 145 (07/14/2009)   HDL: 54.90 (07/14/2009)   TG: 246.0 (07/14/2009)  Problem # 5:  DIABETES MELLITUS, TYPE II (ICD-250.00) Assessment: Unchanged good control due for labs no hypoglycemia  Her updated medication list for this problem includes:    Glipizide 5 Mg Tabs (Glipizide) .Marland Kitchen... Take 1/2 tablet by mouth daily    Aspirin Ec 81 Mg Tbec (Aspirin) .Marland Kitchen... Take one by mouth daily  Labs Reviewed: Creat: 1.3 (07/14/2009)     Last Eye Exam: no retinopathy (10/05/2009) Reviewed HgBA1c results: 7.2 (10/15/2009)  7.1 (07/14/2009)  Problem # 6:  GRIEF REACTION (ICD-309.0) Assessment: Improved seems resolved  Complete Medication List: 1)  Gabapentin 300 Mg Caps (Gabapentin) .... Take 1 tab in the morning and 2 at bedtime 2)  Glipizide 5 Mg Tabs (Glipizide) .... Take 1/2 tablet by mouth daily 3)  Verapamil Hcl Cr 240 Mg Cr-tabs (Verapamil hcl) .... Take 1 tablet by mouth once daily 4)  Aricept 10 Mg Tabs (Donepezil hcl) .... Take one by mouth daily 5)  Amitriptyline Hcl 25 Mg Tabs (Amitriptyline hcl) .... Take one tablet by mouth at bedtime 6)  Aspirin Ec 81 Mg Tbec (Aspirin) .... Take one by mouth daily 7)  Tylenol Arthritis Pain 650 Mg Cr-tabs (Acetaminophen) .Marland Kitchen.. 1 tab by mouth three times a day after meals for back pain 8)  Miralax Powd (Polyethylene glycol 3350) .Marland Kitchen.. 1 bottlecapful, or 1 heaping tablespoon in water/juice daily 9)  Lidoderm 5 % Ptch (Lidocaine) .... Apply daily for 12 hours for pain 10)  Tramadol Hcl 50 Mg Tabs (Tramadol hcl) .... Take 1/2 to 1 tab by mouth every 4 hours as needed for pain 11)  Carvedilol 3.125 Mg Tabs (Carvedilol) .Marland Kitchen.. 1 tab by mouth two times a day 12)  Furosemide 40 Mg Tabs (Furosemide) .Marland Kitchen.. 1 tab daily 13)  Potassium Chloride Crys Cr 20 Meq Cr-tabs (Potassium chloride crys cr) .Marland Kitchen.. 1 tab  daily  Patient Instructions: 1)  Will plan follow up in 2-3 months

## 2010-09-05 ENCOUNTER — Encounter: Payer: Self-pay | Admitting: Internal Medicine

## 2010-09-29 ENCOUNTER — Non-Acute Institutional Stay: Payer: Medicare Other | Admitting: Internal Medicine

## 2010-09-29 ENCOUNTER — Encounter: Payer: Self-pay | Admitting: Internal Medicine

## 2010-09-29 VITALS — BP 118/70 | HR 70 | Temp 97.3°F | Resp 18 | Wt 163.0 lb

## 2010-09-29 DIAGNOSIS — E119 Type 2 diabetes mellitus without complications: Secondary | ICD-10-CM

## 2010-09-29 DIAGNOSIS — I10 Essential (primary) hypertension: Secondary | ICD-10-CM

## 2010-09-29 DIAGNOSIS — F068 Other specified mental disorders due to known physiological condition: Secondary | ICD-10-CM

## 2010-09-29 DIAGNOSIS — I5022 Chronic systolic (congestive) heart failure: Secondary | ICD-10-CM

## 2010-09-29 DIAGNOSIS — M1990 Unspecified osteoarthritis, unspecified site: Secondary | ICD-10-CM

## 2010-09-29 NOTE — Progress Notes (Signed)
  Subjective:    Patient ID: Jeanne Sheppard, female    DOB: [birth year], 77 y.o.   MRN: [id number]  HPI Doing okay [name] RN notes that she has had a little more confusion Occ hallucinations Functional status stable---dresses and uses bathroom independently Needs some help with shower  She feels things are okay Knows she has some limitations Satisfied here Has to use rollator to get around "or I would fall on my face" Some pain but doesn't use meds that often  No heart trouble Some mild edema in feet No palpitations No chest pain Breathing okay but does get DOE if walking faster than her usual slow pace No PND or orthopnea  Staff checks sugars weekly 82-97 NO hypoglycemic spells though  Past Medical History  Diagnosis Date  . Hypertension   . Diabetes mellitus     NIDDM  . Hyperlipidemia   . Arthritis   . CHF (congestive heart failure)     systolic  . Dementia   . Urinary incontinence     Past Surgical History  Procedure Date  . Pacemaker insertion ~2008  . Abdominal hysterectomy   . Breast surgery     negative biopsy  . Cataract extraction     right first, left in 4/11  . Uti/gi problems 8/10    Hospitalized    Family History  Problem Relation Age of Onset  . Early death Neg Hx     History   Social History  . Marital Status: Married    Spouse Name: N/A    Number of Children: 2  . Years of Education: N/A   Occupational History  . Teacher     retired   Social History Main Topics  . Smoking status: Never Smoker   . Smokeless tobacco: Never Used  . Alcohol Use: No     rare in the past  . Drug Use: Not on file  . Sexually Active: Not on file   Other Topics Concern  . Not on file   Social History Narrative   Widowed 2011      Review of Systems Sleeps okay but often awakens at 3 AM. Goes to sleep about 9PM Appetite is fine Weight is stable     Objective:   Physical Exam  Constitutional: She appears well-developed and well-nourished. No  distress.  Neck: Normal range of motion. Neck supple. No thyromegaly present.  Cardiovascular: Normal rate, regular rhythm, normal heart sounds and intact distal pulses.  Exam reveals no gallop.   No murmur heard.      1+ pedal pulses bilat  Pulmonary/Chest: Effort normal and breath sounds normal. No respiratory distress. She has no wheezes. She has no rales.  Abdominal: Soft. There is no tenderness.  Musculoskeletal: She exhibits no tenderness.       No active synovitis Trace edema in left ankle only  Lymphadenopathy:    She has no cervical adenopathy.  Neurological:       Appropriate conversation Still with some insight and reasonable judgement  Skin: Skin is warm. No rash noted. No erythema.       No ulcers  Psychiatric: She has a normal mood and affect. Thought content normal.          Assessment & Plan:

## 2010-10-07 ENCOUNTER — Inpatient Hospital Stay: Payer: Medicare Other | Admitting: Internal Medicine

## 2010-10-08 DIAGNOSIS — I059 Rheumatic mitral valve disease, unspecified: Secondary | ICD-10-CM

## 2010-10-13 DIAGNOSIS — I10 Essential (primary) hypertension: Secondary | ICD-10-CM

## 2010-10-13 DIAGNOSIS — F068 Other specified mental disorders due to known physiological condition: Secondary | ICD-10-CM

## 2010-10-13 DIAGNOSIS — E119 Type 2 diabetes mellitus without complications: Secondary | ICD-10-CM

## 2010-10-13 DIAGNOSIS — I5023 Acute on chronic systolic (congestive) heart failure: Secondary | ICD-10-CM

## 2010-10-17 ENCOUNTER — Encounter: Payer: Medicare Other | Admitting: Cardiovascular Disease

## 2010-11-01 DIAGNOSIS — I10 Essential (primary) hypertension: Secondary | ICD-10-CM

## 2010-11-01 DIAGNOSIS — F068 Other specified mental disorders due to known physiological condition: Secondary | ICD-10-CM

## 2010-11-01 DIAGNOSIS — I5022 Chronic systolic (congestive) heart failure: Secondary | ICD-10-CM

## 2010-11-01 DIAGNOSIS — E119 Type 2 diabetes mellitus without complications: Secondary | ICD-10-CM

## 2010-12-21 ENCOUNTER — Non-Acute Institutional Stay: Payer: Medicare Other | Admitting: Internal Medicine

## 2010-12-21 ENCOUNTER — Encounter: Payer: Self-pay | Admitting: Internal Medicine

## 2010-12-21 VITALS — BP 120/58 | HR 58 | Temp 98.5°F | Resp 16 | Wt 159.0 lb

## 2010-12-21 DIAGNOSIS — F068 Other specified mental disorders due to known physiological condition: Secondary | ICD-10-CM

## 2010-12-21 DIAGNOSIS — E119 Type 2 diabetes mellitus without complications: Secondary | ICD-10-CM

## 2010-12-21 DIAGNOSIS — I5022 Chronic systolic (congestive) heart failure: Secondary | ICD-10-CM

## 2010-12-21 DIAGNOSIS — I10 Essential (primary) hypertension: Secondary | ICD-10-CM

## 2010-12-21 DIAGNOSIS — M549 Dorsalgia, unspecified: Secondary | ICD-10-CM

## 2010-12-21 DIAGNOSIS — G589 Mononeuropathy, unspecified: Secondary | ICD-10-CM

## 2010-12-21 NOTE — Assessment & Plan Note (Signed)
lidoderm really helps occ needs prn meds

## 2010-12-21 NOTE — Assessment & Plan Note (Signed)
Sugars fine off meds A1c okay last time in health care

## 2010-12-21 NOTE — Assessment & Plan Note (Signed)
BP Readings from Last 3 Encounters:  12/21/10 120/58  09/29/10 118/70  06/16/10 102/70   Good control No changes needed

## 2010-12-21 NOTE — Patient Instructions (Signed)
Will plan follow up in about 3 months 

## 2010-12-21 NOTE — Progress Notes (Signed)
Subjective:    Patient ID: Jeanne Sheppard, female    DOB: [birth year], 77 y.o.   MRN: [id number]  HPI Doing well Reviewed status with Albin [name] RN Has been going to PT Feels her strength is better Has to use rollator to keep her balance---then "I can walk normally with it" Uses it all the time, even in apt  Enjoys multiple activities and her social interaction No depression  Weights have been monitored  Fairly stable ~157 Gets some DOE but feels it may be better No chest pain No palpitations  Sugars have been fine 120's-130's No low sugar reactions  No apparent progression of dementia Memory problems seem stable She maintains some insight  She has some back, hip and leg pain lidoderm helps back Doesn't think she uses prn analgesics much  Current Outpatient Prescriptions on File Prior to Visit  Medication Sig Dispense Refill  . acetaminophen (TYLENOL) 650 MG CR tablet Take 650 mg by mouth every 6 (six) hours as needed. For back pain      . amitriptyline (ELAVIL) 25 MG tablet Take 25 mg by mouth at bedtime.        Marland Kitchen aspirin 81 MG EC tablet Take 81 mg by mouth daily.        . carvedilol (COREG) 3.125 MG tablet Take 3.125 mg by mouth 2 (two) times daily with a meal.        . donepezil (ARICEPT) 10 MG tablet Take 10 mg by mouth daily.        . furosemide (LASIX) 40 MG tablet Take 40 mg by mouth daily.        Marland Kitchen gabapentin (NEURONTIN) 300 MG capsule Take 300 mg by mouth 3 (three) times daily. 1 in the morning and 2 at bedtime       . lidocaine (LIDODERM) 5 % Place 1 patch onto the skin daily. Remove & Discard patch within 12 hours or as directed by MD       . polyethylene glycol (MIRALAX / GLYCOLAX) packet Take 17 g by mouth daily.        . traMADol (ULTRAM) 50 MG tablet Take 25 mg by mouth every 4 (four) hours as needed.        Marland Kitchen DISCONTD: potassium chloride SA (K-DUR,KLOR-CON) 20 MEQ tablet Take 20 mEq by mouth daily.        Marland Kitchen DISCONTD: verapamil (CALAN-SR) 240 MG CR tablet Take 240  mg by mouth daily.         Past Medical History  Diagnosis Date  . Hypertension   . Diabetes mellitus     NIDDM  . Hyperlipidemia   . Arthritis   . CHF (congestive heart failure)     systolic  . Dementia   . Urinary incontinence   . Chronic kidney disease   . Restrictive lung disease     Past Surgical History  Procedure Date  . Pacemaker insertion ~2008  . Abdominal hysterectomy   . Breast surgery     negative biopsy  . Cataract extraction     right first, left in 4/11  . Uti/gi problems 8/10    Hospitalized    Family History  Problem Relation Age of Onset  . Early death Neg Hx     History   Social History  . Marital Status: Married    Spouse Name: N/A    Number of Children: 2  . Years of Education: N/A   Occupational History  . Teacher     retired  Social History Main Topics  . Smoking status: Never Smoker   . Smokeless tobacco: Never Used  . Alcohol Use: No     rare in the past  . Drug Use: Not on file  . Sexually Active: Not on file   Other Topics Concern  . Not on file   Social History Narrative   Widowed 2011   Review of Systems Sleeps okay in general---occ awakens at 4AM but usually able to get back to sleep  Appetite is fine Urinary incontinence seems less of a problem of late     Objective:   Physical Exam  Constitutional: She appears well-developed and well-nourished. No distress.  Neck: Normal range of motion. Neck supple. No thyromegaly present.  Cardiovascular: Normal rate, regular rhythm and normal heart sounds.  Exam reveals no gallop.   No murmur heard. Pulmonary/Chest: Effort normal. No respiratory distress. She has no wheezes. She has no rales.       Decreased breath sounds but clear  Abdominal: Soft. There is no tenderness.  Musculoskeletal: Normal range of motion. She exhibits no edema and no tenderness.  Lymphadenopathy:    She has no cervical adenopathy.  Psychiatric: She has a normal mood and affect. Her behavior is  normal.          Assessment & Plan:

## 2010-12-21 NOTE — Assessment & Plan Note (Signed)
Compensated now Weight stable Watching this closely but no changes needed now Coreg, lasix and aspirin

## 2010-12-21 NOTE — Assessment & Plan Note (Signed)
Mild and stable Maintains some insight and remains appropriate for assisted living

## 2010-12-21 NOTE — Assessment & Plan Note (Signed)
Doing well with the gabapentin

## 2011-03-23 ENCOUNTER — Encounter: Payer: Self-pay | Admitting: Internal Medicine

## 2011-03-23 ENCOUNTER — Non-Acute Institutional Stay: Payer: Medicare Other | Admitting: Internal Medicine

## 2011-03-23 VITALS — BP 110/60 | HR 72 | Temp 98.6°F | Resp 16 | Wt 166.0 lb

## 2011-03-23 DIAGNOSIS — M549 Dorsalgia, unspecified: Secondary | ICD-10-CM

## 2011-03-23 DIAGNOSIS — F068 Other specified mental disorders due to known physiological condition: Secondary | ICD-10-CM

## 2011-03-23 DIAGNOSIS — L219 Seborrheic dermatitis, unspecified: Secondary | ICD-10-CM | POA: Insufficient documentation

## 2011-03-23 DIAGNOSIS — G589 Mononeuropathy, unspecified: Secondary | ICD-10-CM

## 2011-03-23 DIAGNOSIS — I5022 Chronic systolic (congestive) heart failure: Secondary | ICD-10-CM

## 2011-03-23 DIAGNOSIS — E119 Type 2 diabetes mellitus without complications: Secondary | ICD-10-CM

## 2011-03-23 DIAGNOSIS — I10 Essential (primary) hypertension: Secondary | ICD-10-CM

## 2011-03-23 NOTE — Assessment & Plan Note (Signed)
Has done well Weight is up some but not clearly fluid Will have them check weight daily during week and give extra lasix prn

## 2011-03-23 NOTE — Assessment & Plan Note (Signed)
Stable cognitive status Still doing well here in assisted living No functional decline

## 2011-03-23 NOTE — Progress Notes (Signed)
Subjective:    Patient ID: Jeanne Sheppard, female    DOB: 03-10-1923, 77 y.o.   MRN: [id number]  HPI She feels she is doing okay Concerned about some scaly breakout on forehead, around ears and down along chin No sig dandruff No itching  Still with low back pain Mostly on the right Worse with certain movements----like getting out of recliner Sharp pain lidoderm not applied to this area---discussed changing that  Weighed regularly Gets extra fluid pill at lunch about weekly Mild edema but not persistent--not present in AM No chest pain No palpitations No sig SOB  Walks okay with rollator. No DOE Showers herself with observation Dresses and uses bathroom herself  Neuropathy pain is stable Continues on meds for this (gabapentin and amitriptylline)  Weekly BP 122/76-98/60 Sugars have been 151 to 173 without meds  Current Outpatient Prescriptions on File Prior to Visit  Medication Sig Dispense Refill  . acetaminophen (TYLENOL) 650 MG CR tablet Take 650 mg by mouth every 6 (six) hours as needed. For back pain      . amitriptyline (ELAVIL) 25 MG tablet Take 25 mg by mouth at bedtime.        Marland Kitchen aspirin 81 MG EC tablet Take 81 mg by mouth daily.        . carvedilol (COREG) 3.125 MG tablet Take 3.125 mg by mouth 2 (two) times daily with a meal.        . donepezil (ARICEPT) 10 MG tablet Take 10 mg by mouth daily.        . furosemide (LASIX) 40 MG tablet Take 40 mg by mouth daily.        Marland Kitchen gabapentin (NEURONTIN) 300 MG capsule Take 300 mg by mouth 3 (three) times daily. 1 in the morning and 2 at bedtime       . lidocaine (LIDODERM) 5 % Place 1 patch onto the skin daily. Remove & Discard patch within 12 hours or as directed by MD       . polyethylene glycol (MIRALAX / GLYCOLAX) packet Take 17 g by mouth daily.        . traMADol (ULTRAM) 50 MG tablet Take 25 mg by mouth every 4 (four) hours as needed.          Not on File  Past Medical History  Diagnosis Date  . Hypertension   .  Diabetes mellitus     NIDDM  . Hyperlipidemia   . Arthritis   . CHF (congestive heart failure)     systolic  . Dementia   . Urinary incontinence   . Chronic kidney disease   . Restrictive lung disease     Past Surgical History  Procedure Date  . Pacemaker insertion ~2008  . Abdominal hysterectomy   . Breast surgery     negative biopsy  . Cataract extraction     right first, left in 4/11  . Uti/gi problems 8/10    Hospitalized    Family History  Problem Relation Age of Onset  . Early death Neg Hx     History   Social History  . Marital Status: Married    Spouse Name: N/A    Number of Children: 2  . Years of Education: N/A   Occupational History  . Teacher     retired   Social History Main Topics  . Smoking status: Never Smoker   . Smokeless tobacco: Never Used  . Alcohol Use: No     rare in the past  .  Drug Use: Not on file  . Sexually Active: Not on file   Other Topics Concern  . Not on file   Social History Narrative   Widowed 2011   Review of Systems Sleeps well Appetite is good Weight is up some--not clear if this is fluid     Objective:   Physical Exam  Constitutional: She appears well-developed and well-nourished. No distress.  Neck: Normal range of motion. Neck supple.  Cardiovascular: Normal rate, regular rhythm and normal heart sounds.  Exam reveals no gallop.   No murmur heard. Pulmonary/Chest: Effort normal and breath sounds normal. No respiratory distress. She has no wheezes. She has no rales.  Abdominal: Soft. There is no tenderness.  Musculoskeletal: She exhibits no edema and no tenderness.  Lymphadenopathy:    She has no cervical adenopathy.  Skin:       Very slight scaling around hair line of forehead  Psychiatric: She has a normal mood and affect. Her behavior is normal. Judgment and thought content normal.          Assessment & Plan:

## 2011-03-23 NOTE — Patient Instructions (Signed)
Please plan follow up appt in about 3 months

## 2011-03-23 NOTE — Assessment & Plan Note (Signed)
Stable Discussed that she can tell the staff where on her back she thinks the patch would work best

## 2011-03-23 NOTE — Assessment & Plan Note (Signed)
Very mild Will use HC cream prn

## 2011-03-23 NOTE — Assessment & Plan Note (Signed)
Sugars have been well controlled without meds Will reeval with next A1c

## 2011-03-23 NOTE — Assessment & Plan Note (Signed)
BP Readings from Last 3 Encounters:  03/23/11 110/60  12/21/10 120/58  09/29/10 118/70   Has been low Continue meds for CHF

## 2011-03-23 NOTE — Assessment & Plan Note (Signed)
Doing well on gabapentin and amitriptylline (this helps her sleep also)

## 2011-05-17 ENCOUNTER — Non-Acute Institutional Stay: Payer: Medicare Other | Admitting: Family Medicine

## 2011-05-17 DIAGNOSIS — L0291 Cutaneous abscess, unspecified: Secondary | ICD-10-CM

## 2011-05-17 DIAGNOSIS — I5022 Chronic systolic (congestive) heart failure: Secondary | ICD-10-CM

## 2011-05-17 DIAGNOSIS — L0390 Cellulitis, unspecified: Secondary | ICD-10-CM | POA: Insufficient documentation

## 2011-05-17 NOTE — Progress Notes (Signed)
Subjective:    Patient ID: Jeanne Sheppard, female    DOB: 09-12-1922, 77 y.o.   MRN: [id number]  HPI Discussed with pt and [name] RN  H/o CHF but has had two days of left foot swelling, with redness and pain. She does not recall injuring it any way. Denies any CP or SOB. Afebrile but did say she feels a little achy.  Weights have been up, so has been on higher dose of lasix- 80 mg daily.  Wt Readings from Last 3 Encounters:  05/17/11 165 lb (74.844 kg)  03/23/11 166 lb (75.297 kg)  12/21/10 159 lb (72.122 kg)   Allergic to amoxicillin. Pt is a diabetic, sugars have been stable.  Current Outpatient Prescriptions on File Prior to Visit  Medication Sig Dispense Refill  . acetaminophen (TYLENOL) 650 MG CR tablet Take 650 mg by mouth every 6 (six) hours as needed. For back pain      . amitriptyline (ELAVIL) 25 MG tablet Take 25 mg by mouth at bedtime.        Marland Kitchen aspirin 81 MG EC tablet Take 81 mg by mouth daily.        . carvedilol (COREG) 3.125 MG tablet Take 3.125 mg by mouth 2 (two) times daily with a meal.        . donepezil (ARICEPT) 10 MG tablet Take 10 mg by mouth daily.        . furosemide (LASIX) 40 MG tablet Take 40 mg by mouth daily.        Marland Kitchen gabapentin (NEURONTIN) 300 MG capsule Take 300 mg by mouth 3 (three) times daily. 1 in the morning and 2 at bedtime       . lidocaine (LIDODERM) 5 % Place 1 patch onto the skin daily. Remove & Discard patch within 12 hours or as directed by MD       . polyethylene glycol (MIRALAX / GLYCOLAX) packet Take 17 g by mouth daily.        . traMADol (ULTRAM) 50 MG tablet Take 25 mg by mouth every 4 (four) hours as needed.         Past Medical History  Diagnosis Date  . Hypertension   . Diabetes mellitus     NIDDM  . Hyperlipidemia   . Arthritis   . CHF (congestive heart failure)     systolic  . Dementia   . Urinary incontinence   . Chronic kidney disease   . Restrictive lung disease     Past Surgical History  Procedure Date  .  Pacemaker insertion ~2008  . Abdominal hysterectomy   . Breast surgery     negative biopsy  . Cataract extraction     right first, left in 4/11  . Uti/gi problems 8/10    Hospitalized    Family History  Problem Relation Age of Onset  . Early death Neg Hx     History   Social History  . Marital Status: Married    Spouse Name: N/A    Number of Children: 2  . Years of Education: N/A   Occupational History  . Teacher     retired   Social History Main Topics  . Smoking status: Never Smoker   . Smokeless tobacco: Never Used  . Alcohol Use: No     rare in the past  . Drug Use: Not on file  . Sexually Active: Not on file   Other Topics Concern  . Not on file   Social History  Narrative   Widowed 2011   Review of Systems See HPI     Objective:   Physical Exam  BP 112/70  Pulse 82  Temp 98.3 F (36.8 C)  Resp 16  Wt 165 lb (74.844 kg)  Constitutional: She appears well-developed and well-nourished. No distress.  Neck: Normal range of motion. Neck supple. No thyromegaly present.  Cardiovascular: Normal rate, regular rhythm and normal heart sounds.  Exam reveals no gallop.   No murmur heard. Pulmonary/Chest: Effort normal. No respiratory distress. She has no wheezes. She has no rales.  Abdominal: Soft. There is no tenderness.  Musculoskeletal: Normal range of motion.  left leg:  Pos edema of entire foot, left ankle, good pulses, pos erythema and warmth, no abrasions but skin is dry over toes Right leg:  No edema   She has no cervical adenopathy.  Psychiatric: She has a normal mood and affect. Her behavior is normal.     Assessment & Plan:   1. Cellulitis   New.   Will treat with doxycycline - 100 mg twice daily x 7 days. Discussed with Albin [name], RN. Does not seem consistent with DVT at this time but if symptoms do not improve, consider dupplex doppler.  2. Chronic systolic heart failure

## 2011-06-05 IMAGING — CR DG CHEST 2V
1 series · 3 of 3 positions shown · non-contrast
Comparison: none

REASON FOR EXAM: sob
COMMENTS:

PROCEDURE:     DXR - DXR CHEST PA (OR AP) AND LATERAL  - March 29, 2010  [DATE]
RESULT:     Small, bilateral pleural effusions are noted with bibasilar
atelectasis. Cardiac pacer noted with lead tips in right atrium/right
ventricle.

[Series 1: view not recorded · 0.17mm/px · 3 of 3 slices shown]
[im 1/3]
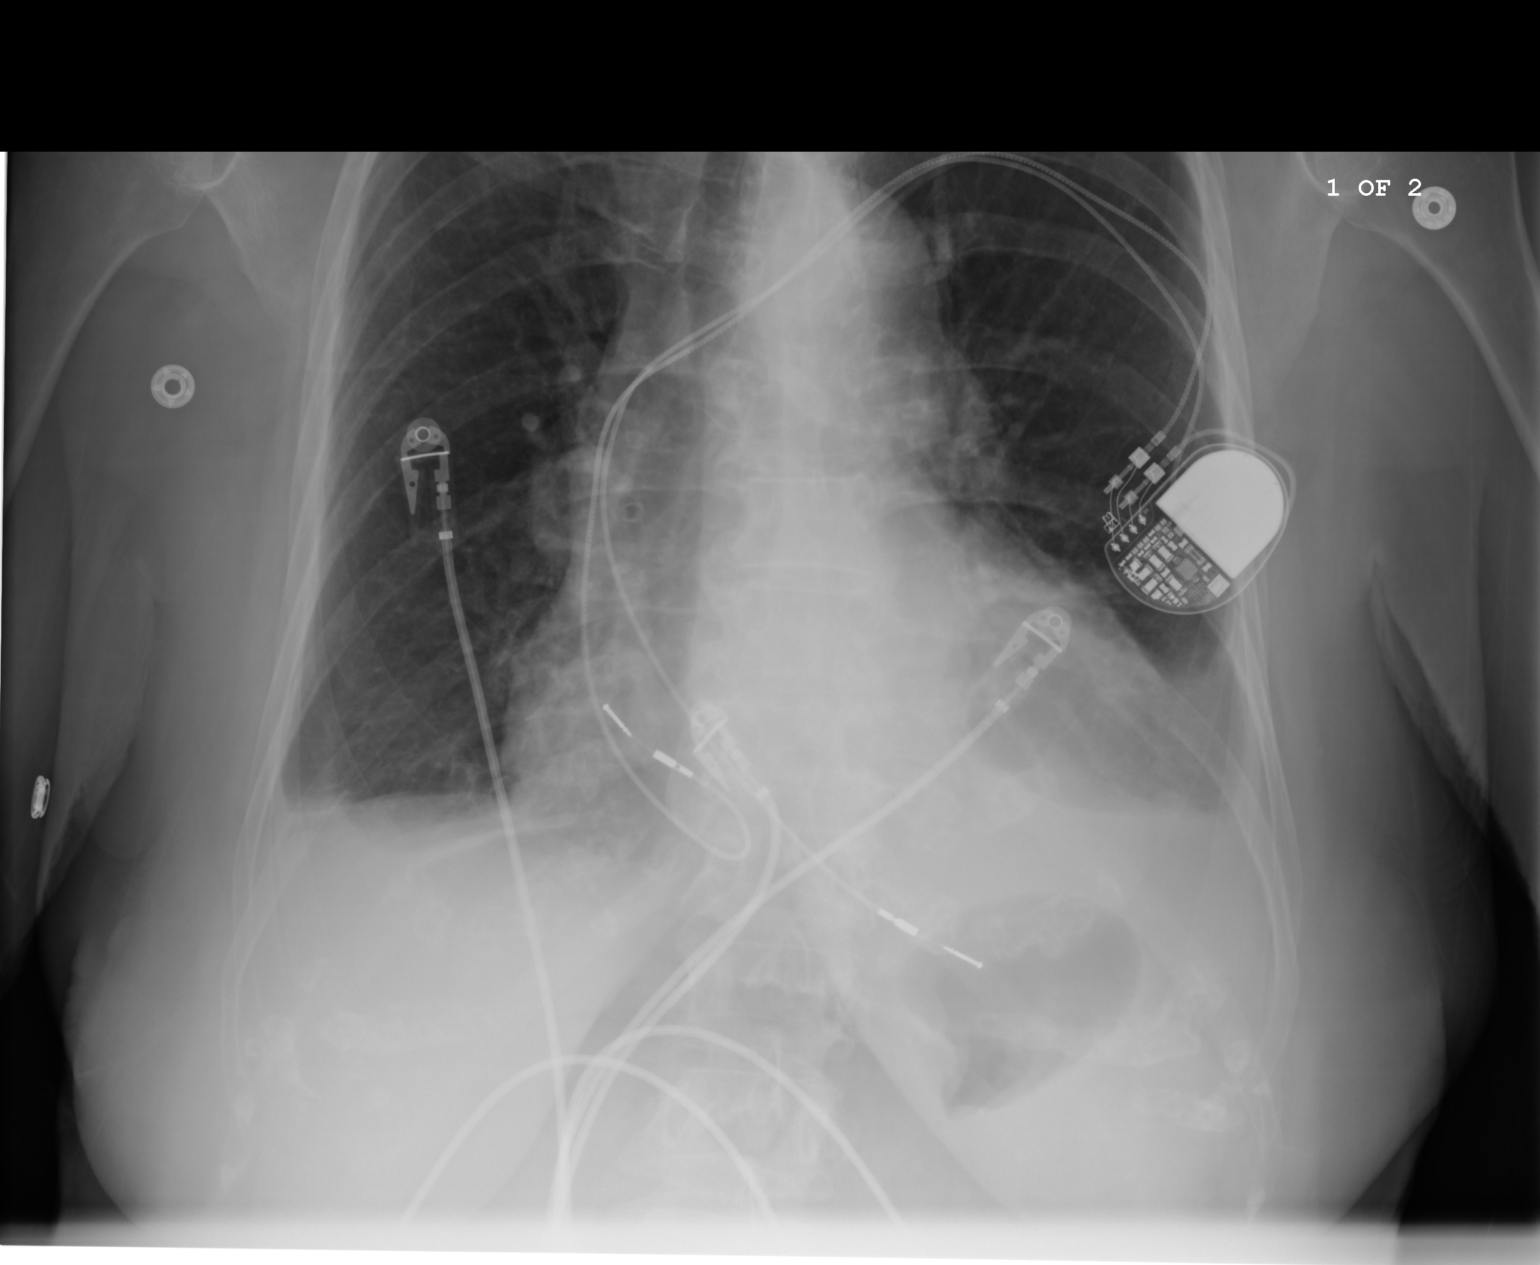
[im 2/3]
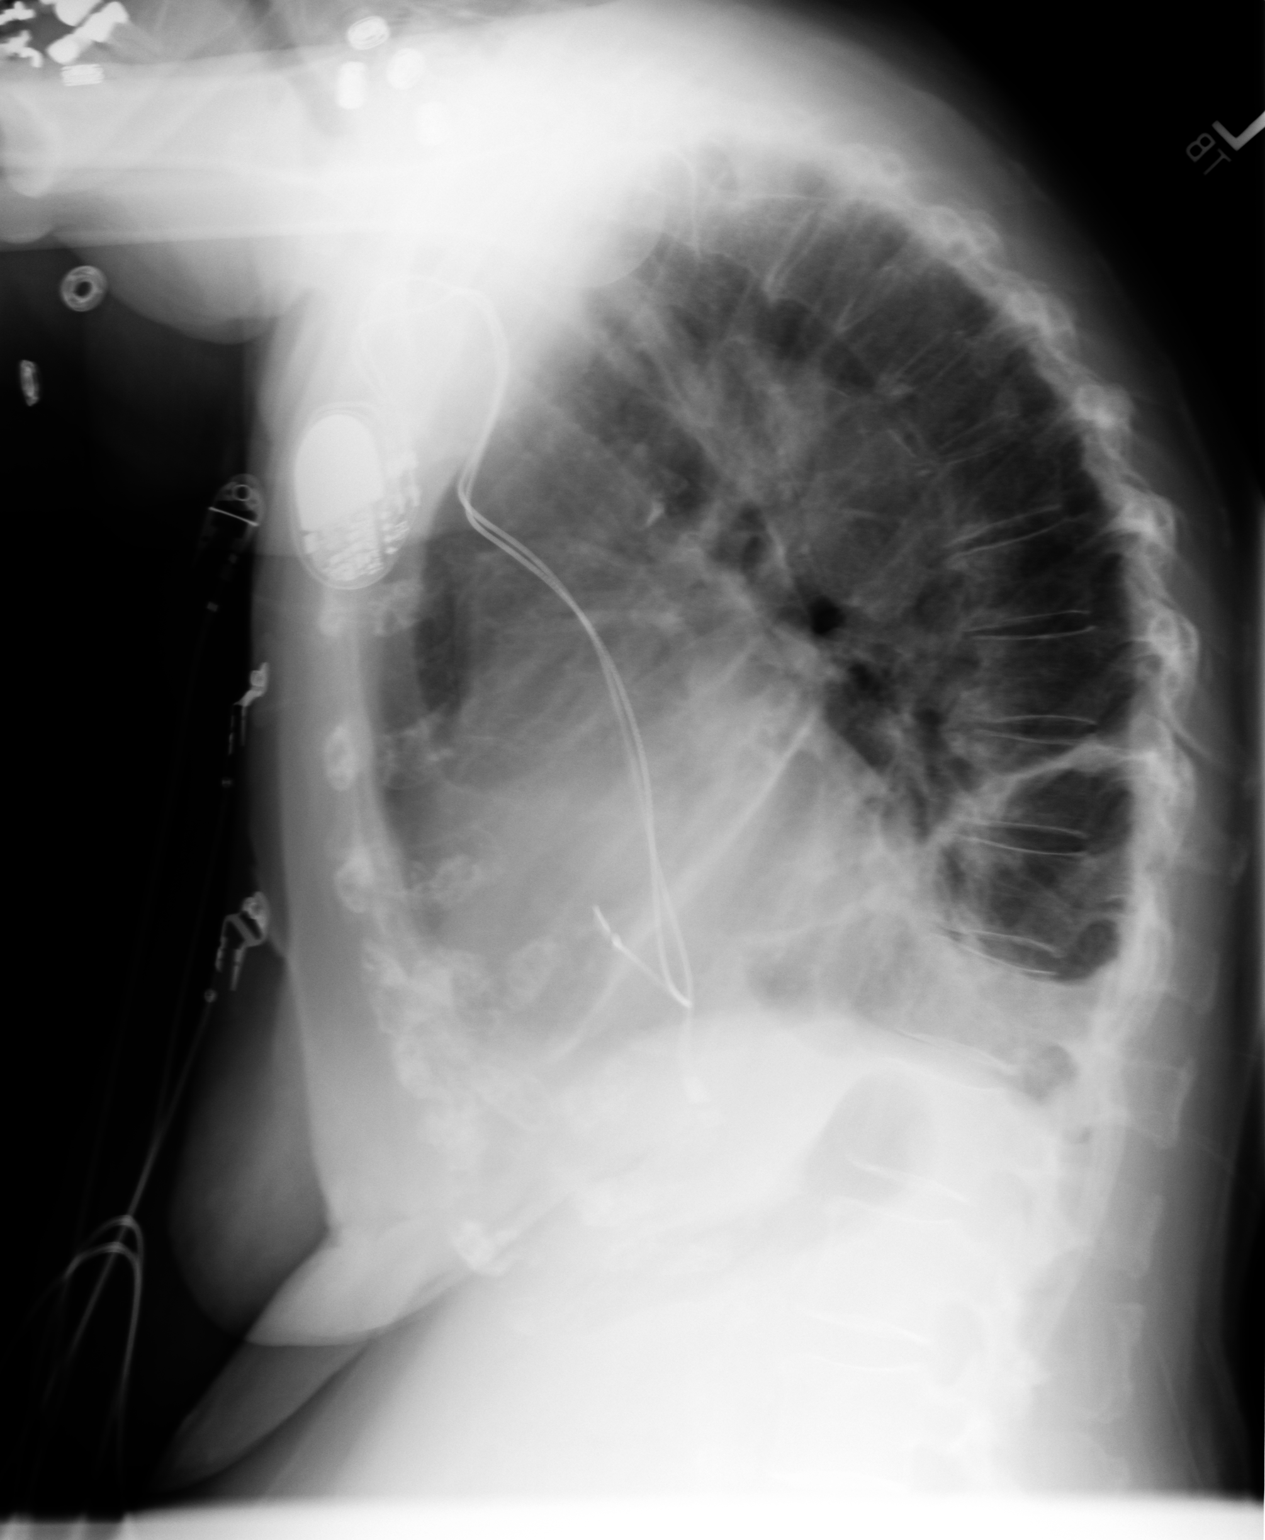
[im 3/3]
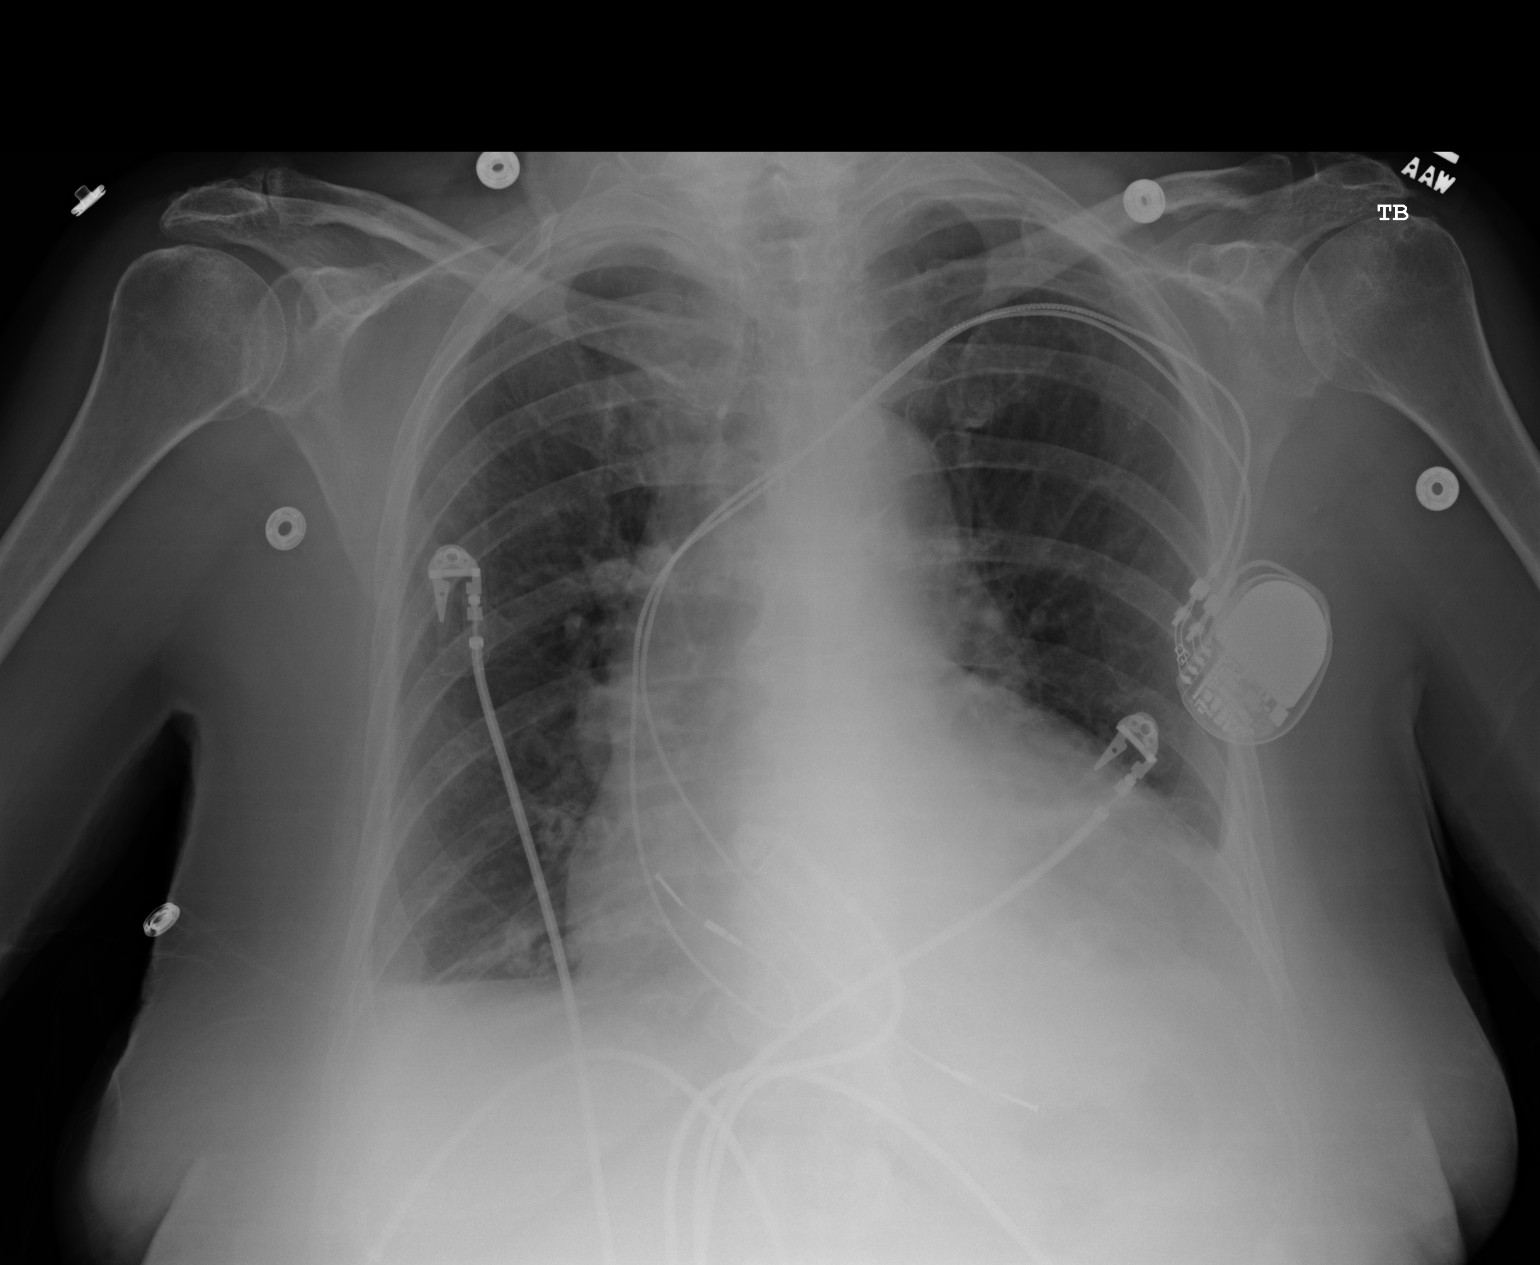

[3 of 3 positions shown; findings below may reference images not displayed]

IMPRESSION: 1.  Small, bilateral pleural effusions with bibasilar atelectasis.
2. Cardiac pacer noted with lead tips in the right atrium/right ventricle.

## 2011-06-08 ENCOUNTER — Encounter: Payer: Self-pay | Admitting: Internal Medicine

## 2011-08-02 ENCOUNTER — Emergency Department: Payer: Self-pay | Admitting: Emergency Medicine

## 2011-08-02 LAB — COMPREHENSIVE METABOLIC PANEL
Albumin: 3.2 g/dL — ABNORMAL LOW (ref 3.4–5.0)
Alkaline Phosphatase: 90 U/L (ref 50–136)
BUN: 27 mg/dL — ABNORMAL HIGH (ref 7–18)
Calcium, Total: 9.9 mg/dL (ref 8.5–10.1)
Co2: 33 mmol/L — ABNORMAL HIGH (ref 21–32)
EGFR (African American): 49 — ABNORMAL LOW
EGFR (Non-African Amer.): 40 — ABNORMAL LOW
Glucose: 199 mg/dL — ABNORMAL HIGH (ref 65–99)
SGOT(AST): 28 U/L (ref 15–37)
Sodium: 144 mmol/L (ref 136–145)

## 2011-08-02 LAB — URINALYSIS, COMPLETE
Bilirubin,UR: NEGATIVE
Blood: NEGATIVE
Glucose,UR: 50 mg/dL (ref 0–75)
Hyaline Cast: 4
Ketone: NEGATIVE
Protein: NEGATIVE
Specific Gravity: 1.016 (ref 1.003–1.030)
Squamous Epithelial: 2

## 2011-08-02 LAB — CBC
HCT: 39.6 % (ref 35.0–47.0)
MCH: 33.5 pg (ref 26.0–34.0)
MCHC: 33.8 g/dL (ref 32.0–36.0)
RDW: 13.6 % (ref 11.5–14.5)

## 2011-08-02 LAB — TROPONIN I: Troponin-I: 0.04 ng/mL

## 2011-08-02 LAB — PRO B NATRIURETIC PEPTIDE: B-Type Natriuretic Peptide: 4277 pg/mL — ABNORMAL HIGH (ref 0–450)

## 2011-08-07 ENCOUNTER — Ambulatory Visit (INDEPENDENT_AMBULATORY_CARE_PROVIDER_SITE_OTHER): Payer: Medicare Other | Admitting: Internal Medicine

## 2011-08-07 ENCOUNTER — Encounter: Payer: Self-pay | Admitting: Internal Medicine

## 2011-08-07 DIAGNOSIS — I5022 Chronic systolic (congestive) heart failure: Secondary | ICD-10-CM

## 2011-08-07 DIAGNOSIS — M25572 Pain in left ankle and joints of left foot: Secondary | ICD-10-CM | POA: Insufficient documentation

## 2011-08-07 DIAGNOSIS — M25579 Pain in unspecified ankle and joints of unspecified foot: Secondary | ICD-10-CM

## 2011-08-07 NOTE — Assessment & Plan Note (Signed)
Unilateral foot edema Doesn't seem to represent exacerbation of CHF No change in meds

## 2011-08-07 NOTE — Assessment & Plan Note (Addendum)
No signs of sprain or other injury--the pain on weight bearing suggests that this could still be part of the process Seems to have discomfort from the swelling Mild warmth in foot but nothing to suggest cellulitis Edema unilateral suggests venous insuff but her pulse there is stronger --may have better arterial perfusion, then venous congestion  Will try support hose for now If calf pain, would consider checking duplex scan of calf Is on diuretic prn

## 2011-08-07 NOTE — Progress Notes (Signed)
Subjective:    Patient ID: Jeanne Sheppard, female    DOB: 14-Nov-1922, 78 y.o.   MRN: [id number]  HPI Here with aide  Has been having some trouble with left ankle She notes back 1 week or more--but not Chiropractor (and she does have memory issues) Some leg pain also  No fever Having some trouble walking on it Uses walker to get around  No chest pain No SOB  Current Outpatient Prescriptions on File Prior to Visit  Medication Sig Dispense Refill  . acetaminophen (TYLENOL) 650 MG CR tablet Take 650 mg by mouth every 6 (six) hours as needed. For back pain      . amitriptyline (ELAVIL) 25 MG tablet Take 25 mg by mouth at bedtime.        Marland Kitchen aspirin 81 MG EC tablet Take 81 mg by mouth daily.        . carvedilol (COREG) 3.125 MG tablet Take 3.125 mg by mouth 2 (two) times daily with a meal.        . donepezil (ARICEPT) 10 MG tablet Take 10 mg by mouth daily.        . furosemide (LASIX) 40 MG tablet Take 40 mg by mouth daily. If weight is over 160      . gabapentin (NEURONTIN) 300 MG capsule Take 300 mg by mouth daily.       Marland Kitchen lidocaine (LIDODERM) 5 % Place 1 patch onto the skin daily. Remove & Discard patch within 12 hours or as directed by MD       . polyethylene glycol (MIRALAX / GLYCOLAX) packet Take 17 g by mouth daily.        . traMADol (ULTRAM) 50 MG tablet Take 25 mg by mouth every 4 (four) hours as needed.          Allergies  Allergen Reactions  . Amoxicillin     Past Medical History  Diagnosis Date  . Hypertension   . Diabetes mellitus     NIDDM  . Hyperlipidemia   . Arthritis   . CHF (congestive heart failure)     systolic  . Dementia   . Urinary incontinence   . Chronic kidney disease   . Restrictive lung disease     Past Surgical History  Procedure Date  . Pacemaker insertion ~2008  . Abdominal hysterectomy   . Breast surgery     negative biopsy  . Cataract extraction     right first, left in 4/11  . Uti/gi problems 8/10    Hospitalized    Family History    Problem Relation Age of Onset  . Early death Neg Hx     History   Social History  . Marital Status: Married    Spouse Name: N/A    Number of Children: 2  . Years of Education: N/A   Occupational History  . Teacher     retired   Social History Main Topics  . Smoking status: Never Smoker   . Smokeless tobacco: Never Used  . Alcohol Use: No     rare in the past  . Drug Use: Not on file  . Sexually Active: Not on file   Other Topics Concern  . Not on file   Social History Narrative   Widowed 2011   Review of Systems Appetite is okay Sleeping fine    Objective:   Physical Exam  Constitutional: She appears well-developed and well-nourished. No distress.  Neck: Normal range of motion. Neck supple. No  thyromegaly present.  Cardiovascular: Normal rate, regular rhythm and normal heart sounds.  Exam reveals no gallop.   No murmur heard.      Normal pulse on left Decreased on right  Musculoskeletal:       No malleolar tenderness on left Normal passive ROM 1+ pitting in left foot and ankle but not right No calf tenderness---Homan's sign absent  Lymphadenopathy:    She has no cervical adenopathy.  Neurological:       Able to bear weight and walk a few steps  Skin:       Mild dependent rubor in left foot          Assessment & Plan:

## 2011-08-24 ENCOUNTER — Encounter: Payer: Self-pay | Admitting: Internal Medicine

## 2011-08-24 ENCOUNTER — Non-Acute Institutional Stay: Payer: Medicare Other | Admitting: Internal Medicine

## 2011-08-24 VITALS — BP 112/64 | HR 68 | Temp 98.5°F | Resp 16 | Wt 159.0 lb

## 2011-08-24 DIAGNOSIS — R1084 Generalized abdominal pain: Secondary | ICD-10-CM

## 2011-08-24 DIAGNOSIS — E119 Type 2 diabetes mellitus without complications: Secondary | ICD-10-CM

## 2011-08-24 DIAGNOSIS — I10 Essential (primary) hypertension: Secondary | ICD-10-CM

## 2011-08-24 DIAGNOSIS — I5022 Chronic systolic (congestive) heart failure: Secondary | ICD-10-CM

## 2011-08-24 DIAGNOSIS — M1990 Unspecified osteoarthritis, unspecified site: Secondary | ICD-10-CM

## 2011-08-24 DIAGNOSIS — F068 Other specified mental disorders due to known physiological condition: Secondary | ICD-10-CM

## 2011-08-24 NOTE — Assessment & Plan Note (Signed)
BP Readings from Last 3 Encounters:  08/24/11 112/64  08/07/11 110/70  05/17/11 112/70   Good control No changes needed

## 2011-08-24 NOTE — Assessment & Plan Note (Signed)
On aricept without clear result Will stop Consider restart at 5mg  if staff notes functional or cognitive loss off med

## 2011-08-24 NOTE — Assessment & Plan Note (Addendum)
Hasn't been a problem of late Tramadol causes drowsiness and hallucinations when tried for abd pain---will stop this

## 2011-08-24 NOTE — Assessment & Plan Note (Signed)
Seems to be controlled Check A1c again in May

## 2011-08-24 NOTE — Assessment & Plan Note (Signed)
Edema better Seems compensated Weight down No changes

## 2011-08-24 NOTE — Assessment & Plan Note (Signed)
Vague and intermittent Will stop the aricept in case that is the cause

## 2011-08-24 NOTE — Progress Notes (Signed)
Subjective:    Patient ID: Jeanne Sheppard, female    DOB: Apr 07, 1923, 78 y.o.   MRN: [id number]  HPI Has been having intermittent stomach trouble Appetite off at these times Okay other times  Breathing is okay No dyspnea with showering or walking with walker Swelling in legs is better with support hose  Memory has been stable No reports of functional or cognitive changes from [facility] She notes no problems  No major arthritis pain Back and shoulder have hurt some with getting up and turning in the past few days  Current Outpatient Prescriptions on File Prior to Visit  Medication Sig Dispense Refill  . acetaminophen (TYLENOL) 650 MG CR tablet Take 650 mg by mouth every 6 (six) hours as needed. For back pain      . amitriptyline (ELAVIL) 25 MG tablet Take 25 mg by mouth at bedtime.        Marland Kitchen aspirin 81 MG EC tablet Take 81 mg by mouth daily.        . carvedilol (COREG) 3.125 MG tablet Take 3.125 mg by mouth 2 (two) times daily with a meal.        . donepezil (ARICEPT) 10 MG tablet Take 10 mg by mouth daily.        . furosemide (LASIX) 40 MG tablet Take 40 mg by mouth daily. If weight is over 160      . gabapentin (NEURONTIN) 300 MG capsule Take 300 mg by mouth daily. And 600mg  at bedtime      . hydrocortisone 2.5 % cream Apply 1 application topically 2 (two) times daily.      Marland Kitchen lidocaine (LIDODERM) 5 % Place 1 patch onto the skin daily. Remove & Discard patch within 12 hours or as directed by MD       . polyethylene glycol (MIRALAX / GLYCOLAX) packet Take 17 g by mouth daily.          Allergies  Allergen Reactions  . Tramadol     Drowsy and hallucination  . Amoxicillin     Past Medical History  Diagnosis Date  . Hypertension   . Diabetes mellitus     NIDDM  . Hyperlipidemia   . Arthritis   . CHF (congestive heart failure)     systolic  . Dementia   . Urinary incontinence   . Chronic kidney disease   . Restrictive lung disease     Past Surgical History  Procedure  Date  . Pacemaker insertion ~2008  . Abdominal hysterectomy   . Breast surgery     negative biopsy  . Cataract extraction     right first, left in 4/11  . Uti/gi problems 8/10    Hospitalized    Family History  Problem Relation Age of Onset  . Early death Neg Hx     History   Social History  . Marital Status: Married    Spouse Name: N/A    Number of Children: 2  . Years of Education: N/A   Occupational History  . Teacher     retired   Social History Main Topics  . Smoking status: Never Smoker   . Smokeless tobacco: Never Used  . Alcohol Use: No     rare in the past  . Drug Use: Not on file  . Sexually Active: Not on file   Other Topics Concern  . Not on file   Social History Narrative   Widowed 2011   Review of Systems Sleeps okay Weight  is down a few pounds     Objective:   Physical Exam  Constitutional: No distress.       Lying in bed Skipping lunch today  Neck: Normal range of motion. Neck supple.  Cardiovascular: Normal rate, regular rhythm and normal heart sounds.  Exam reveals no gallop.   No murmur heard. Pulmonary/Chest: Effort normal and breath sounds normal. No respiratory distress. She has no wheezes. She has no rales.  Abdominal: Soft. She exhibits no mass. There is no rebound and no guarding.       ?slight generalized sensitivity (not truly tender)  Musculoskeletal: She exhibits no edema and no tenderness.  Lymphadenopathy:    She has no cervical adenopathy.  Psychiatric: She has a normal mood and affect. Her behavior is normal.          Assessment & Plan:  Has bee

## 2011-09-28 ENCOUNTER — Telehealth: Payer: Self-pay | Admitting: Internal Medicine

## 2011-09-28 NOTE — Telephone Encounter (Signed)
Saw lab result while here at [facility] A1c up to 11.4 Creat elevated at 1.6 Will restart glipizide

## 2011-10-04 ENCOUNTER — Ambulatory Visit (INDEPENDENT_AMBULATORY_CARE_PROVIDER_SITE_OTHER): Payer: Medicare Other | Admitting: Internal Medicine

## 2011-10-04 ENCOUNTER — Ambulatory Visit: Payer: Medicare Other | Admitting: Internal Medicine

## 2011-10-04 ENCOUNTER — Encounter: Payer: Self-pay | Admitting: Internal Medicine

## 2011-10-04 VITALS — BP 110/60 | HR 75 | Temp 97.7°F | Wt 165.0 lb

## 2011-10-04 DIAGNOSIS — E119 Type 2 diabetes mellitus without complications: Secondary | ICD-10-CM

## 2011-10-04 DIAGNOSIS — M1710 Unilateral primary osteoarthritis, unspecified knee: Secondary | ICD-10-CM

## 2011-10-04 DIAGNOSIS — M1711 Unilateral primary osteoarthritis, right knee: Secondary | ICD-10-CM | POA: Insufficient documentation

## 2011-10-04 NOTE — Assessment & Plan Note (Signed)
A1c up over 11% Discussed this Glipizide just restarted

## 2011-10-04 NOTE — Assessment & Plan Note (Signed)
Probably has mild arthritis but she probably twisted it or had acute injury causing the severe pain  Discussed alternatives and proceeded with arthrocentesis  Sterile prep Medial approach 2cc 2% plain lido for anaesthesia 9cc of blood tinged synovial fluid removed 40mg  depomedrol instilled with 6cc 2% lidocaine Marked improvement in pain after Discussed care back at assisted living---try heat unless burning, then use ice

## 2011-10-04 NOTE — Progress Notes (Signed)
Subjective:    Patient ID: Jeanne Sheppard, female    DOB: [birth year], 78 y.o.   MRN: [id number]  HPI Here with Larita Fife, daughter  Having trouble with right knee pain Has "stymied me" May have bumped it recently  Some swelling today---nurse noted Having trouble walking around Needed 2 people to help her stand and had to use wheelchair to get to dining room this AM  Reviewed her high sugars Now back on the glipizide She hadn't realized They try to limit concentrated sweets  Current Outpatient Prescriptions on File Prior to Visit  Medication Sig Dispense Refill  . acetaminophen (TYLENOL) 650 MG CR tablet Take 650 mg by mouth every 6 (six) hours as needed. For back pain      . amitriptyline (ELAVIL) 25 MG tablet Take 25 mg by mouth at bedtime.        Marland Kitchen aspirin 81 MG EC tablet Take 81 mg by mouth daily.        . carvedilol (COREG) 3.125 MG tablet Take 3.125 mg by mouth 2 (two) times daily with a meal.        . furosemide (LASIX) 40 MG tablet Take 40 mg by mouth daily. If weight is over 160      . furosemide (LASIX) 80 MG tablet Take 80 mg by mouth daily.      Marland Kitchen gabapentin (NEURONTIN) 300 MG capsule Take 300 mg by mouth daily. And 600mg  at bedtime      . hydrocortisone 2.5 % cream Apply 1 application topically 2 (two) times daily.      Marland Kitchen lidocaine (LIDODERM) 5 % Place 1 patch onto the skin daily. Remove & Discard patch within 12 hours or as directed by MD       . polyethylene glycol (MIRALAX / GLYCOLAX) packet Take 17 g by mouth daily.          Allergies  Allergen Reactions  . Tramadol     Drowsy and hallucination  . Amoxicillin     Past Medical History  Diagnosis Date  . Hypertension   . Diabetes mellitus     NIDDM  . Hyperlipidemia   . Arthritis   . CHF (congestive heart failure)     systolic  . Dementia   . Urinary incontinence   . Chronic kidney disease   . Restrictive lung disease     Past Surgical History  Procedure Date  . Pacemaker insertion ~2008  . Abdominal  hysterectomy   . Breast surgery     negative biopsy  . Cataract extraction     right first, left in 4/11  . Uti/gi problems 8/10    Hospitalized    Family History  Problem Relation Age of Onset  . Early death Neg Hx     History   Social History  . Marital Status: Married    Spouse Name: N/A    Number of Children: 2  . Years of Education: N/A   Occupational History  . Teacher     retired   Social History Main Topics  . Smoking status: Never Smoker   . Smokeless tobacco: Never Used  . Alcohol Use: No     rare in the past  . Drug Use: Not on file  . Sexually Active: Not on file   Other Topics Concern  . Not on file   Social History Narrative   Widowed 2011   Review of Systems No fever No edema     Objective:   Physical Exam  Constitutional: She appears well-developed and well-nourished. No distress.  Musculoskeletal:       Mild effusion in right knee Pain with flexion--fairly sig No obvious meniscus findings No ligament instability          Assessment & Plan:

## 2011-10-10 ENCOUNTER — Other Ambulatory Visit: Payer: Self-pay | Admitting: *Deleted

## 2011-10-26 ENCOUNTER — Encounter: Payer: Self-pay | Admitting: Internal Medicine

## 2011-10-26 ENCOUNTER — Non-Acute Institutional Stay: Payer: Medicare Other | Admitting: Internal Medicine

## 2011-10-26 VITALS — BP 122/62 | HR 77 | Temp 98.2°F | Resp 16 | Wt 165.0 lb

## 2011-10-26 DIAGNOSIS — E119 Type 2 diabetes mellitus without complications: Secondary | ICD-10-CM

## 2011-10-26 DIAGNOSIS — I10 Essential (primary) hypertension: Secondary | ICD-10-CM

## 2011-10-26 DIAGNOSIS — M1711 Unilateral primary osteoarthritis, right knee: Secondary | ICD-10-CM

## 2011-10-26 DIAGNOSIS — G589 Mononeuropathy, unspecified: Secondary | ICD-10-CM

## 2011-10-26 DIAGNOSIS — E1149 Type 2 diabetes mellitus with other diabetic neurological complication: Secondary | ICD-10-CM

## 2011-10-26 DIAGNOSIS — M1710 Unilateral primary osteoarthritis, unspecified knee: Secondary | ICD-10-CM

## 2011-10-26 DIAGNOSIS — I5022 Chronic systolic (congestive) heart failure: Secondary | ICD-10-CM

## 2011-10-26 DIAGNOSIS — F068 Other specified mental disorders due to known physiological condition: Secondary | ICD-10-CM

## 2011-10-26 NOTE — Assessment & Plan Note (Signed)
Better with the cortisone shot Has tylenol for prn use Walking better with rollator

## 2011-10-26 NOTE — Progress Notes (Signed)
Subjective:    Patient ID: Jeanne Sheppard, female    DOB: 12/23/1922, 78 y.o.   MRN: [id number]  HPI Reviewed status with Albin [name] RN Doing well  Knee is much better since cortisone shot Pain not much of an issue  Has some chronic left leg edema Support hose helps Weight has been stable Hasn't really needed extra lasix No SOB No sig cough  No headaches No chest pain No dizziness or syncope  No sig memory changes Maintains reasonable independence here  Fasting sugar 105-190 No hypoglycemic reactions on the med  Current Outpatient Prescriptions on File Prior to Visit  Medication Sig Dispense Refill  . acetaminophen (TYLENOL) 650 MG CR tablet Take 650 mg by mouth every 6 (six) hours as needed. For back pain      . amitriptyline (ELAVIL) 25 MG tablet Take 25 mg by mouth at bedtime.        Marland Kitchen aspirin 81 MG EC tablet Take 81 mg by mouth daily.        . carvedilol (COREG) 3.125 MG tablet Take 3.125 mg by mouth 2 (two) times daily with a meal.        . donepezil (ARICEPT) 10 MG tablet Take 5 mg by mouth daily.       . furosemide (LASIX) 80 MG tablet Take 80 mg by mouth daily.      Marland Kitchen gabapentin (NEURONTIN) 300 MG capsule Take 300 mg by mouth daily. And 600mg  at bedtime      . glipiZIDE (GLUCOTROL XL) 5 MG 24 hr tablet Take 5 mg by mouth daily.      . hydrocortisone 2.5 % cream Apply 1 application topically 2 (two) times daily.      Marland Kitchen lidocaine (LIDODERM) 5 % Place 1 patch onto the skin daily. Remove & Discard patch within 12 hours or as directed by MD       . polyethylene glycol (MIRALAX / GLYCOLAX) packet Take 17 g by mouth daily.        Marland Kitchen DISCONTD: furosemide (LASIX) 40 MG tablet Take 40 mg by mouth daily. If weight is over 160        Allergies  Allergen Reactions  . Tramadol     Drowsy and hallucination  . Amoxicillin     Past Medical History  Diagnosis Date  . Hypertension   . Diabetes mellitus     NIDDM  . Hyperlipidemia   . Arthritis   . CHF (congestive heart failure)      systolic  . Dementia   . Urinary incontinence   . Chronic kidney disease   . Restrictive lung disease     Past Surgical History  Procedure Date  . Pacemaker insertion ~2008  . Abdominal hysterectomy   . Breast surgery     negative biopsy  . Cataract extraction     right first, left in 4/11  . Uti/gi problems 8/10    Hospitalized    Family History  Problem Relation Age of Onset  . Early death Neg Hx     History   Social History  . Marital Status: Married    Spouse Name: N/A    Number of Children: 2  . Years of Education: N/A   Occupational History  . Teacher     retired   Social History Main Topics  . Smoking status: Never Smoker   . Smokeless tobacco: Never Used  . Alcohol Use: No     rare in the past  . Drug Use:  Not on file  . Sexually Active: Not on file   Other Topics Concern  . Not on file   Social History Narrative   Widowed 2011   Review of Systems Appetite is good Weight stable Sleeps well    Objective:   Physical Exam  Constitutional: She appears well-developed and well-nourished. No distress.  Neck: Normal range of motion. Neck supple.  Cardiovascular: Normal rate, regular rhythm and normal heart sounds.  Exam reveals no gallop.   No murmur heard. Pulmonary/Chest: Effort normal and breath sounds normal. No respiratory distress. She has no wheezes. She has no rales.  Abdominal: Soft. There is no tenderness.  Musculoskeletal: She exhibits no edema and no tenderness.       No edema but left calf is a little bigger than right  Lymphadenopathy:    She has no cervical adenopathy.  Psychiatric: She has a normal mood and affect. Her behavior is normal.          Assessment & Plan:

## 2011-10-26 NOTE — Assessment & Plan Note (Addendum)
Left foot and leg has some numbness No sig pain Continues on the gabapentin

## 2011-10-26 NOTE — Assessment & Plan Note (Signed)
Breathing better Weight stable on lasix Seems to be compensated

## 2011-10-26 NOTE — Assessment & Plan Note (Signed)
BP Readings from Last 3 Encounters:  10/26/11 122/62  10/04/11 110/60  08/24/11 112/64   Good control on CHF meds

## 2011-10-26 NOTE — Assessment & Plan Note (Signed)
Sugars clearly better Goal A1c under 9% Fasting sugars acceptable and no hypoglycemia

## 2011-10-26 NOTE — Assessment & Plan Note (Signed)
Mild and stable Continues on donepezil----lower dose though

## 2011-11-21 ENCOUNTER — Other Ambulatory Visit: Payer: Self-pay | Admitting: *Deleted

## 2011-11-21 DIAGNOSIS — I5022 Chronic systolic (congestive) heart failure: Secondary | ICD-10-CM

## 2011-11-21 DIAGNOSIS — M1710 Unilateral primary osteoarthritis, unspecified knee: Secondary | ICD-10-CM

## 2011-11-21 NOTE — Telephone Encounter (Signed)
Spoke with [facility] and advised results 

## 2011-11-21 NOTE — Telephone Encounter (Signed)
rx for gabapentin written 11/15/2010 was only for 300mg  once daily, please advise on instructions.

## 2011-11-21 NOTE — Telephone Encounter (Signed)
Just admitted to health care for now Will get meds there for the time being

## 2011-11-28 DIAGNOSIS — I5022 Chronic systolic (congestive) heart failure: Secondary | ICD-10-CM

## 2011-11-28 DIAGNOSIS — F068 Other specified mental disorders due to known physiological condition: Secondary | ICD-10-CM

## 2011-11-28 DIAGNOSIS — M1710 Unilateral primary osteoarthritis, unspecified knee: Secondary | ICD-10-CM

## 2011-11-28 DIAGNOSIS — E119 Type 2 diabetes mellitus without complications: Secondary | ICD-10-CM

## 2011-12-14 DIAGNOSIS — E119 Type 2 diabetes mellitus without complications: Secondary | ICD-10-CM

## 2011-12-14 DIAGNOSIS — I5022 Chronic systolic (congestive) heart failure: Secondary | ICD-10-CM

## 2011-12-14 DIAGNOSIS — M1710 Unilateral primary osteoarthritis, unspecified knee: Secondary | ICD-10-CM

## 2011-12-14 DIAGNOSIS — F068 Other specified mental disorders due to known physiological condition: Secondary | ICD-10-CM

## 2011-12-14 IMAGING — CR DG CHEST 2V
1 series · 2 of 2 positions shown · non-contrast
Comparison: none

REASON FOR EXAM: Shortness of Breath
COMMENTS:   May transport without cardiac monitor

[Series 1: view not recorded · 0.17mm/px · 2 of 2 slices shown]
[im 1/2]
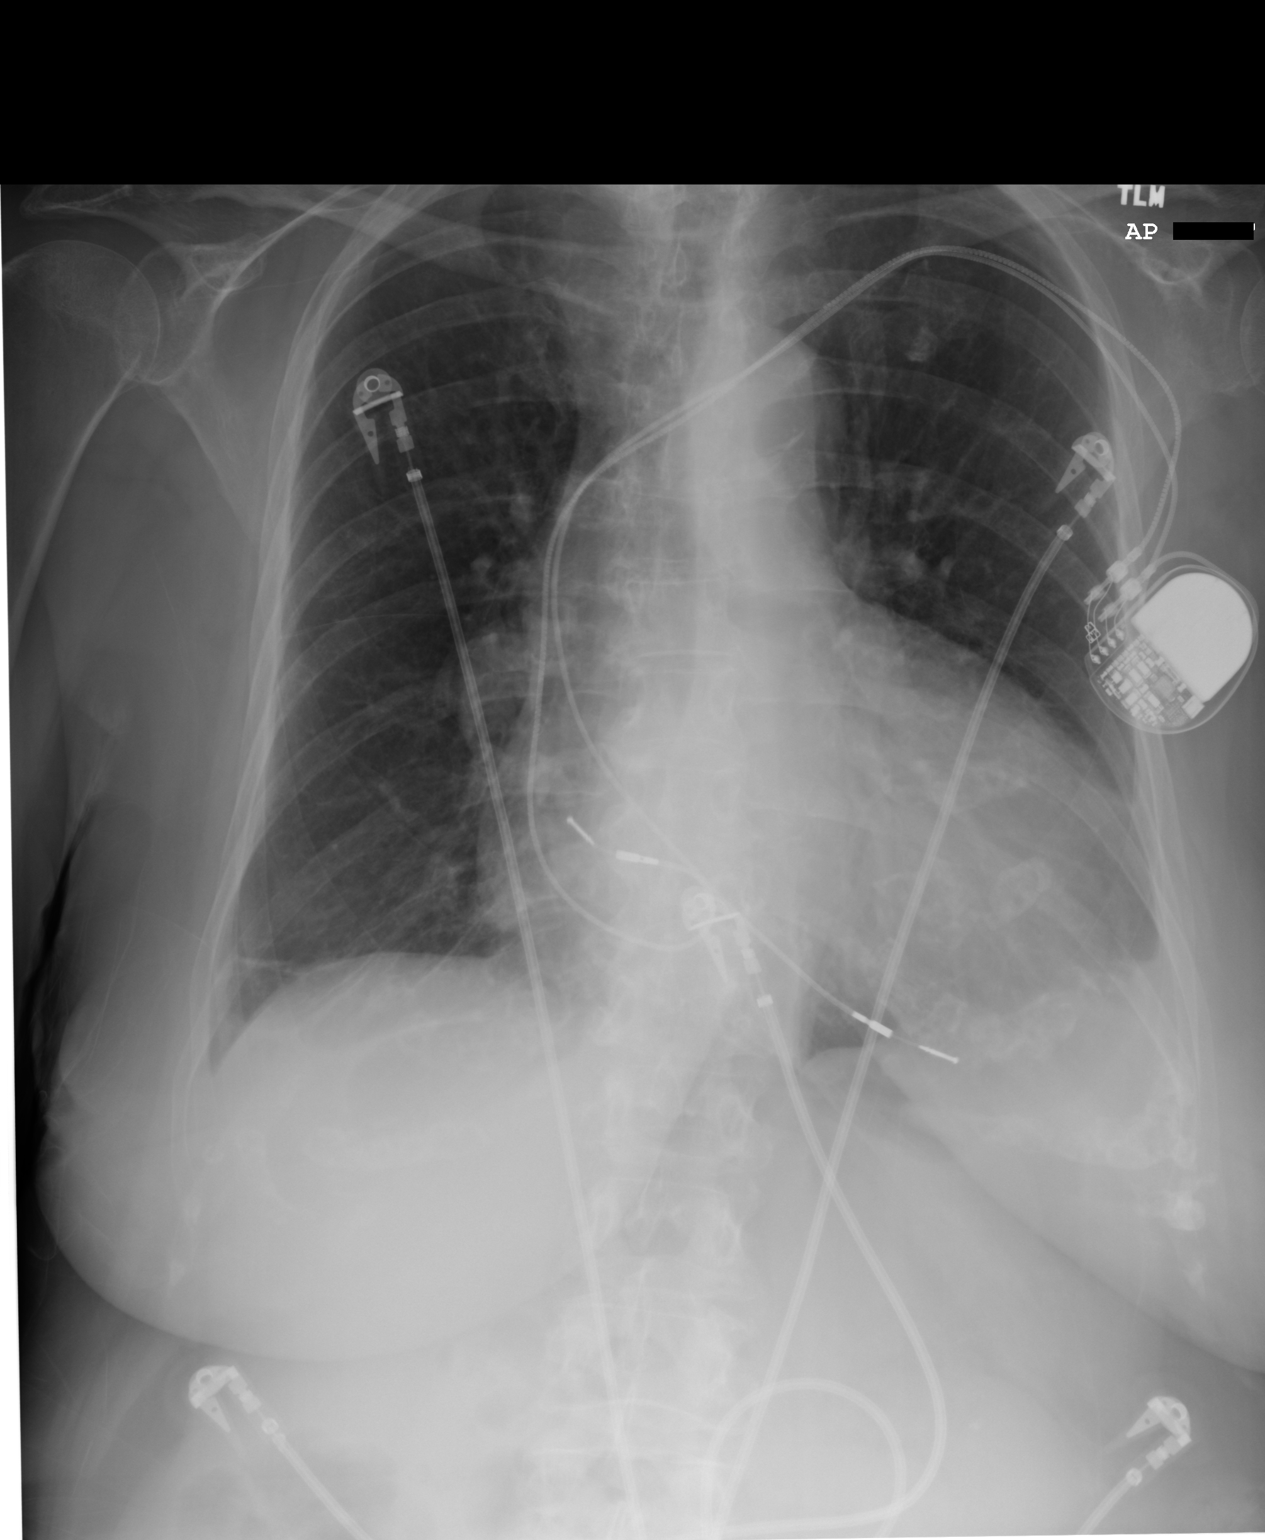
[im 2/2]
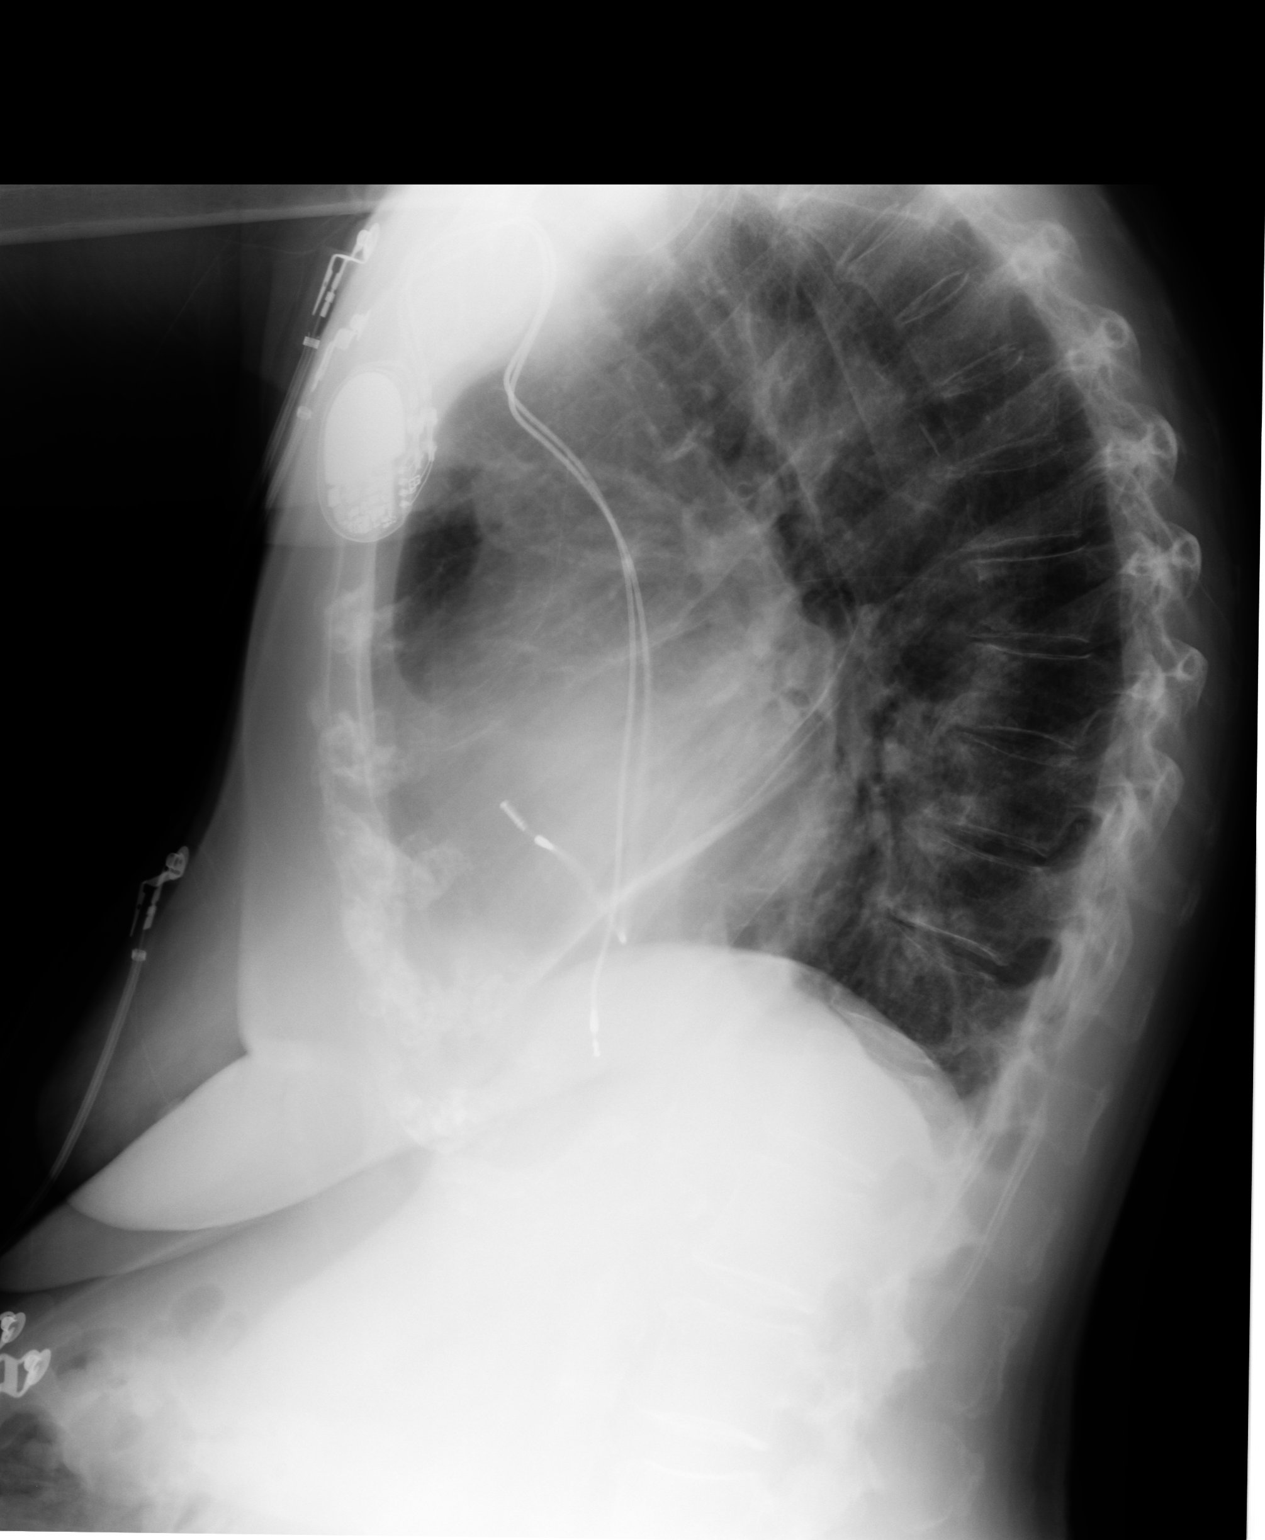

[2 of 2 positions shown; findings below may reference images not displayed]

PROCEDURE:     DXR - DXR CHEST PA (OR AP) AND LATERAL  - October 07, 2010  [DATE]

RESULT:     Comparison is made to a prior study dated 03/29/2010.

There is thickening of the interstitial markings. An area of increased
density projects in the region of the left lower lobe and lingula. There is
blunting of the left costophrenic angle. The cardiac silhouette is enlarged
indicative of cardiomegaly. A left-sided pectoralis pacing unit is
appreciated with lead tips projecting in the region of the right atrium and
right ventricle. The visualized bony skeleton is unremarkable
IMPRESSION: 1. Interstitial infiltrate edematous versus nonedematous.
2. Focal infiltrate left lower lobe and lingula region.
3. Chronic changes versus small effusion on the left.
4. Cardiomegaly.

## 2012-01-19 DIAGNOSIS — F329 Major depressive disorder, single episode, unspecified: Secondary | ICD-10-CM

## 2012-01-19 DIAGNOSIS — I10 Essential (primary) hypertension: Secondary | ICD-10-CM

## 2012-01-19 DIAGNOSIS — I509 Heart failure, unspecified: Secondary | ICD-10-CM

## 2012-01-19 DIAGNOSIS — N189 Chronic kidney disease, unspecified: Secondary | ICD-10-CM

## 2012-02-20 DIAGNOSIS — I5022 Chronic systolic (congestive) heart failure: Secondary | ICD-10-CM

## 2012-02-20 DIAGNOSIS — F068 Other specified mental disorders due to known physiological condition: Secondary | ICD-10-CM

## 2012-02-20 DIAGNOSIS — G589 Mononeuropathy, unspecified: Secondary | ICD-10-CM

## 2012-02-20 DIAGNOSIS — E119 Type 2 diabetes mellitus without complications: Secondary | ICD-10-CM

## 2012-04-23 DIAGNOSIS — I5022 Chronic systolic (congestive) heart failure: Secondary | ICD-10-CM

## 2012-04-23 DIAGNOSIS — G609 Hereditary and idiopathic neuropathy, unspecified: Secondary | ICD-10-CM

## 2012-04-23 DIAGNOSIS — F068 Other specified mental disorders due to known physiological condition: Secondary | ICD-10-CM

## 2012-04-23 DIAGNOSIS — E1149 Type 2 diabetes mellitus with other diabetic neurological complication: Secondary | ICD-10-CM

## 2012-06-10 DIAGNOSIS — I10 Essential (primary) hypertension: Secondary | ICD-10-CM

## 2012-06-10 DIAGNOSIS — I509 Heart failure, unspecified: Secondary | ICD-10-CM

## 2012-06-10 DIAGNOSIS — F329 Major depressive disorder, single episode, unspecified: Secondary | ICD-10-CM

## 2012-06-10 DIAGNOSIS — E1165 Type 2 diabetes mellitus with hyperglycemia: Secondary | ICD-10-CM

## 2012-06-10 DIAGNOSIS — F068 Other specified mental disorders due to known physiological condition: Secondary | ICD-10-CM

## 2012-07-04 DIAGNOSIS — J069 Acute upper respiratory infection, unspecified: Secondary | ICD-10-CM

## 2012-07-09 DIAGNOSIS — J209 Acute bronchitis, unspecified: Secondary | ICD-10-CM

## 2012-08-20 DIAGNOSIS — M1710 Unilateral primary osteoarthritis, unspecified knee: Secondary | ICD-10-CM

## 2012-08-20 DIAGNOSIS — E1149 Type 2 diabetes mellitus with other diabetic neurological complication: Secondary | ICD-10-CM

## 2012-08-20 DIAGNOSIS — F068 Other specified mental disorders due to known physiological condition: Secondary | ICD-10-CM

## 2012-08-20 DIAGNOSIS — I5022 Chronic systolic (congestive) heart failure: Secondary | ICD-10-CM

## 2012-09-16 DIAGNOSIS — M7989 Other specified soft tissue disorders: Secondary | ICD-10-CM

## 2012-10-08 IMAGING — US US EXTREM LOW VENOUS*L*
1 series · 14 of 24 positions shown · non-contrast
Comparison: none

REASON FOR EXAM: asymmetric swelling in leg and shortness of breath
COMMENTS:

PROCEDURE:     US  - US DOPPLER LOW EXTR LEFT  - August 02, 2011  [DATE]
RESULT:     Left lower extremity color-flow and duplex Doppler is normal.

[Series 1: us extrem low venous*left* · 14 of 27 slices shown]
[im 1/27]
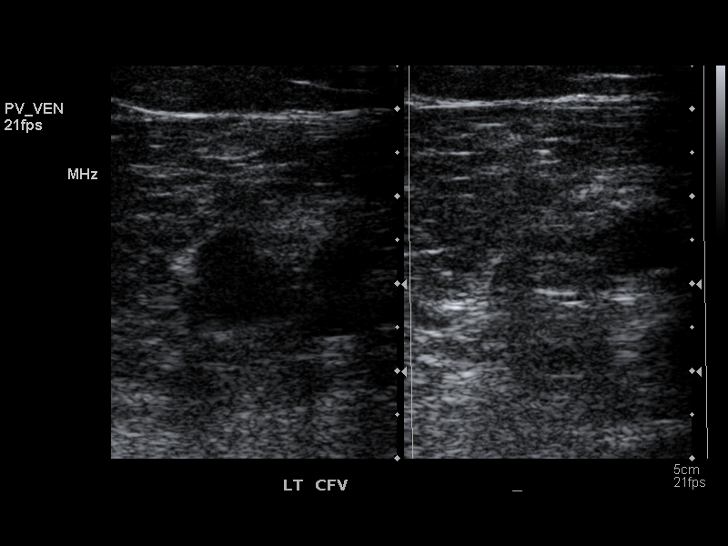
[im 3/27]
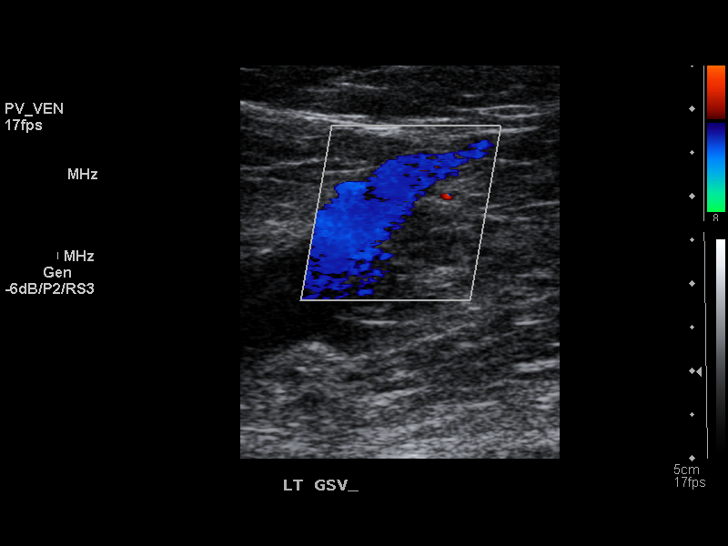
[im 5/27]
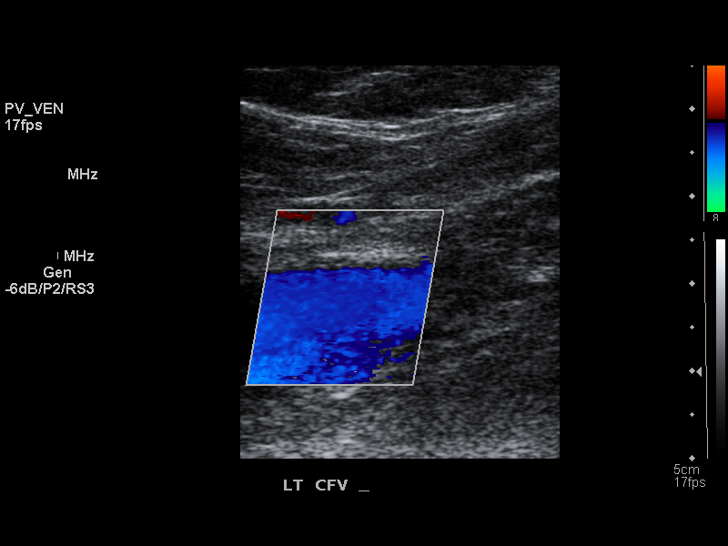
[im 7/27]
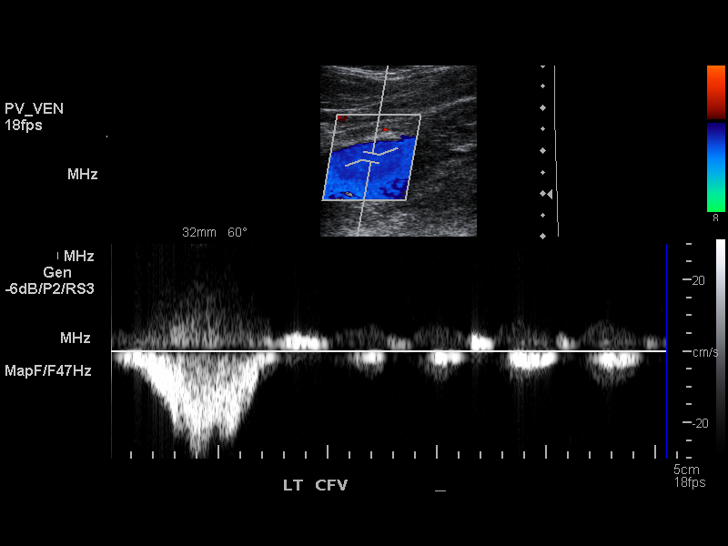
[im 8/27]
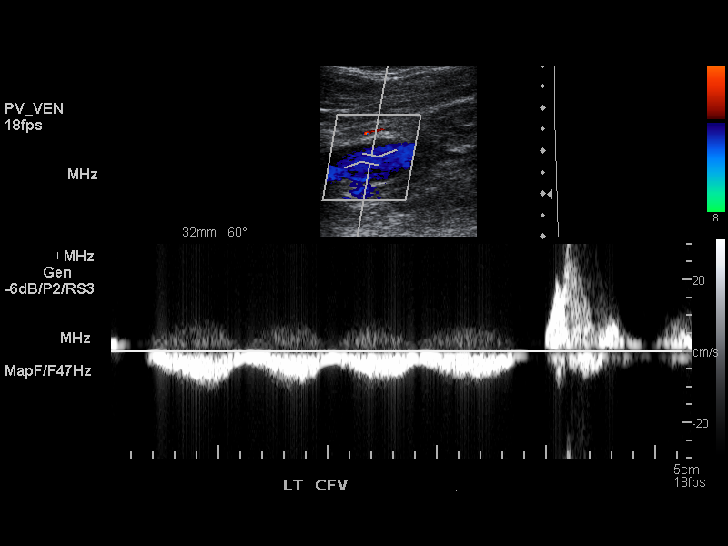
[im 11/27]
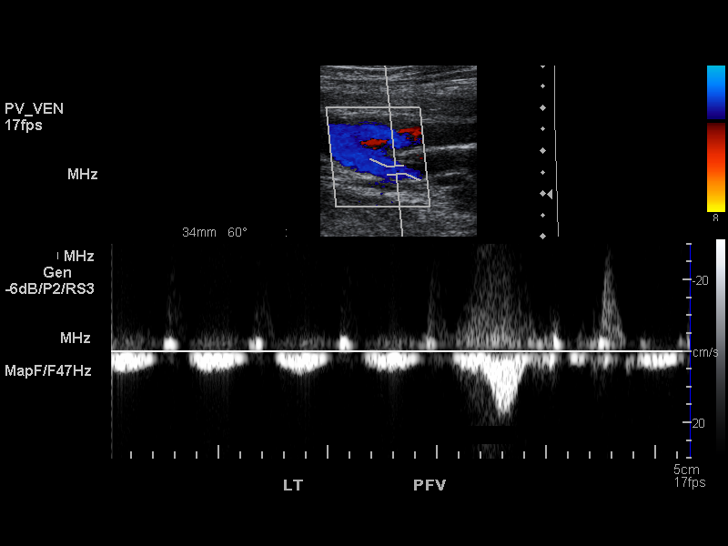
[im 13/27]
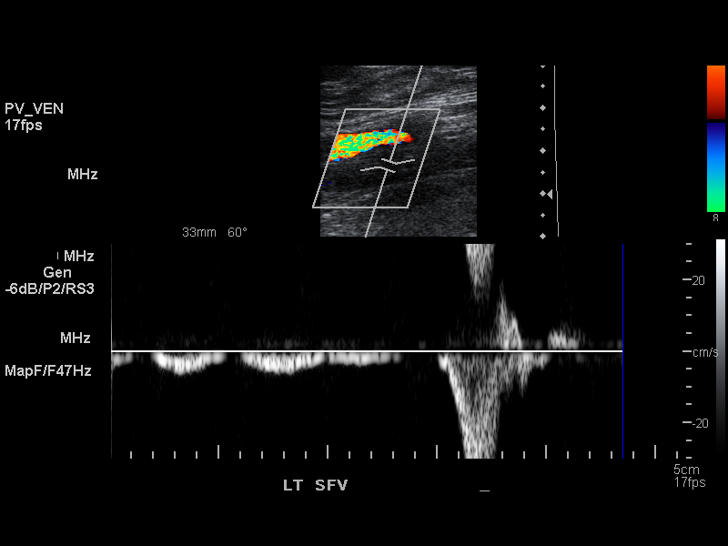
[im 14/27]
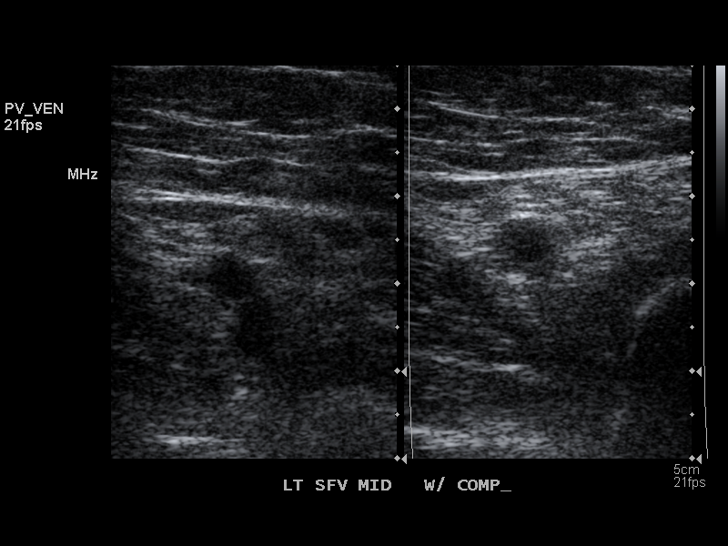
[im 16/27]
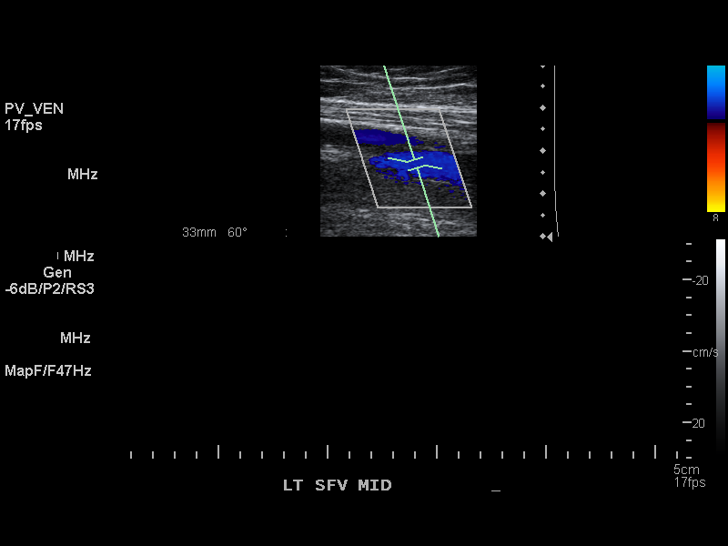
[im 19/27]
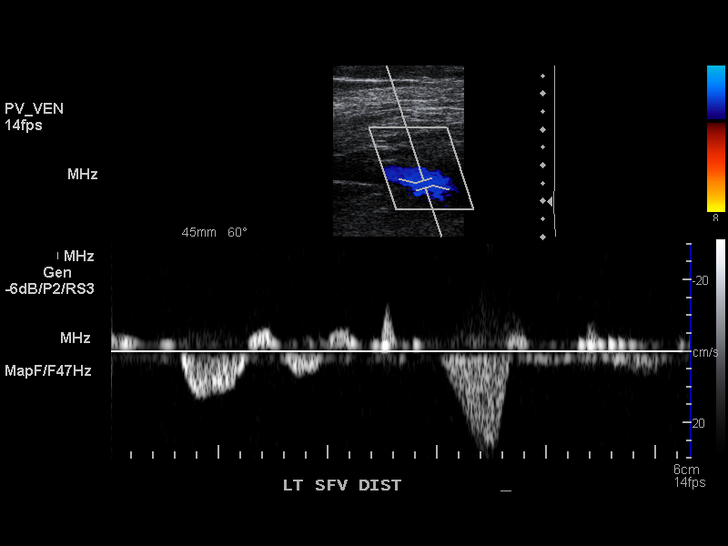
[im 21/27]
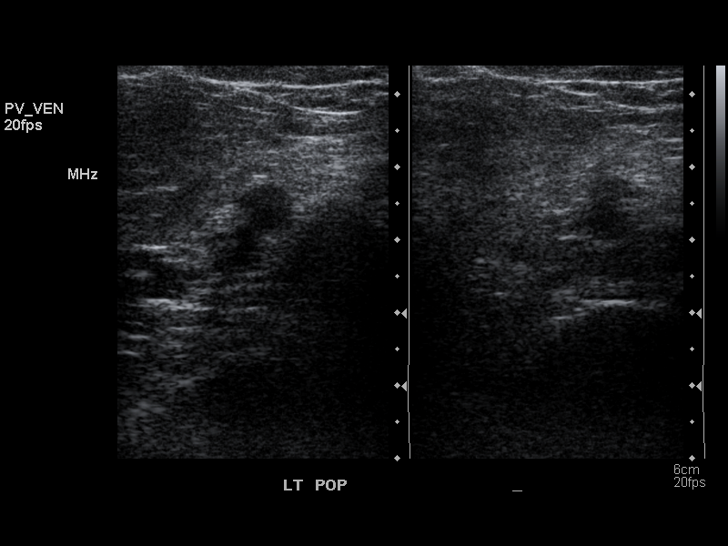
[im 22/27]
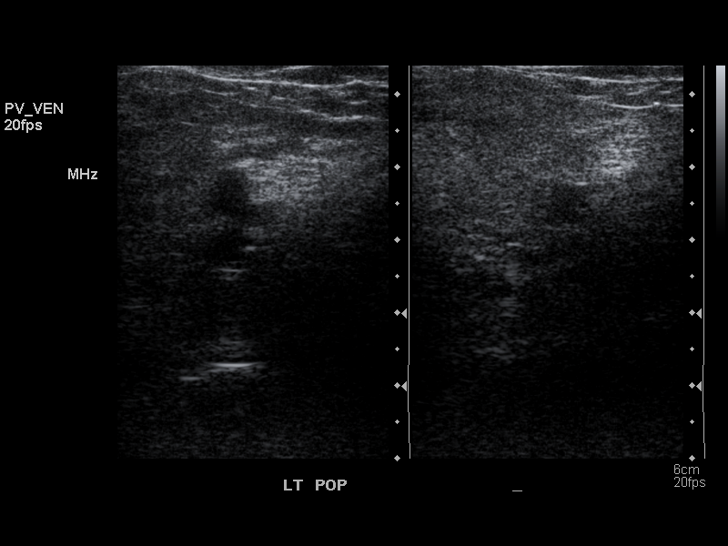
[im 24/27]
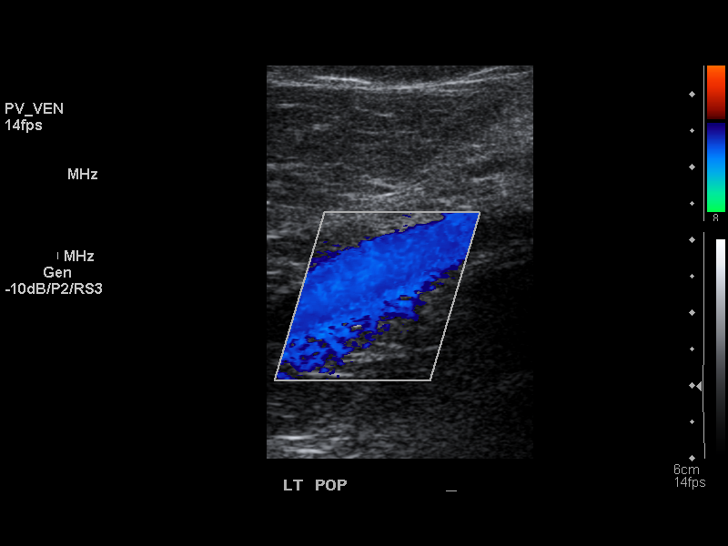
[im 27/27]
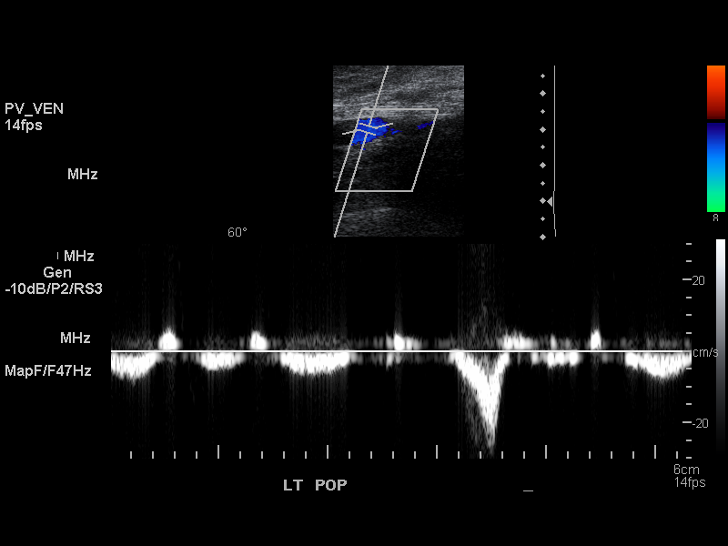

[14 of 24 positions shown; findings below may reference images not displayed]

IMPRESSION: Normal exam.

## 2012-10-08 IMAGING — CR DG CHEST 2V
1 series · 2 of 2 positions shown · non-contrast
Comparison: none

REASON FOR EXAM: shortness of breath, dizziness
COMMENTS:   May transport without cardiac monitor

PROCEDURE:     DXR - DXR CHEST PA (OR AP) AND LATERAL  - August 02, 2011  [DATE]
RESULT:     Cardiomegaly is present. Cardiac pacer is present with tip noted
in the right atrium and right ventricle.

[Series 1: x chest ap · 0.14mm/px · 2 of 2 slices shown]
[im 1/2]
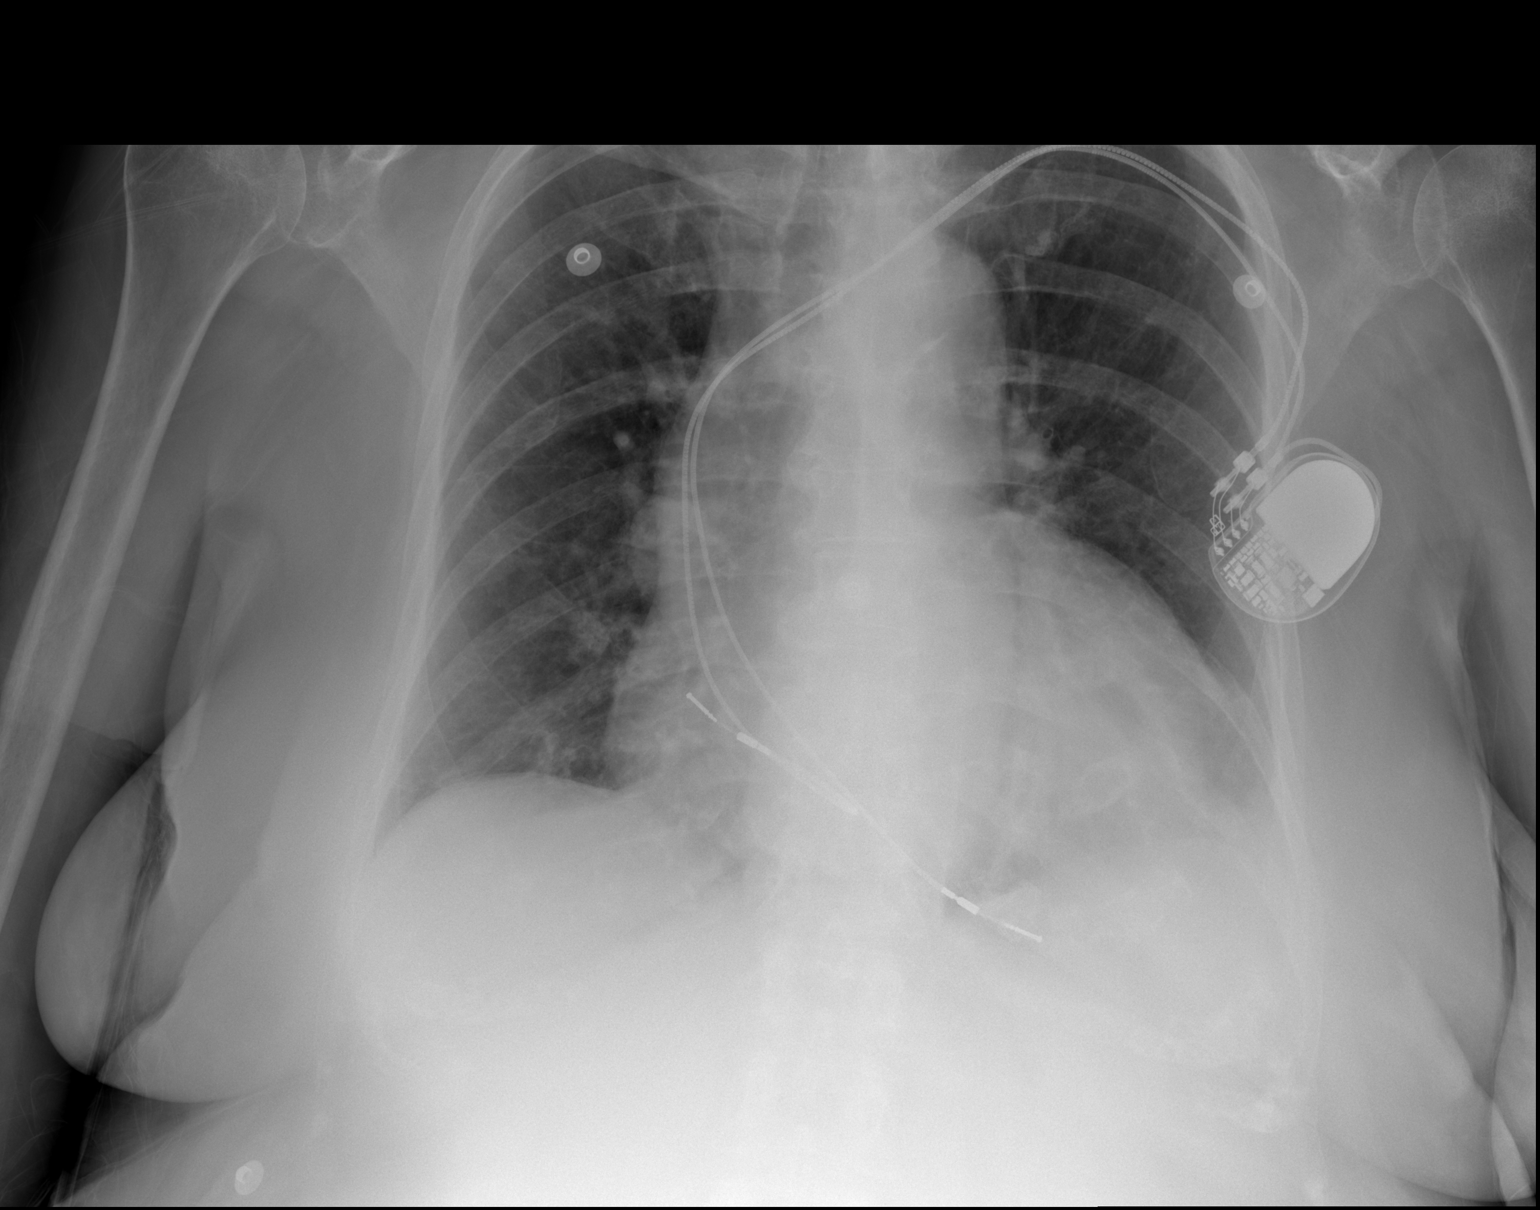
[im 2/2]
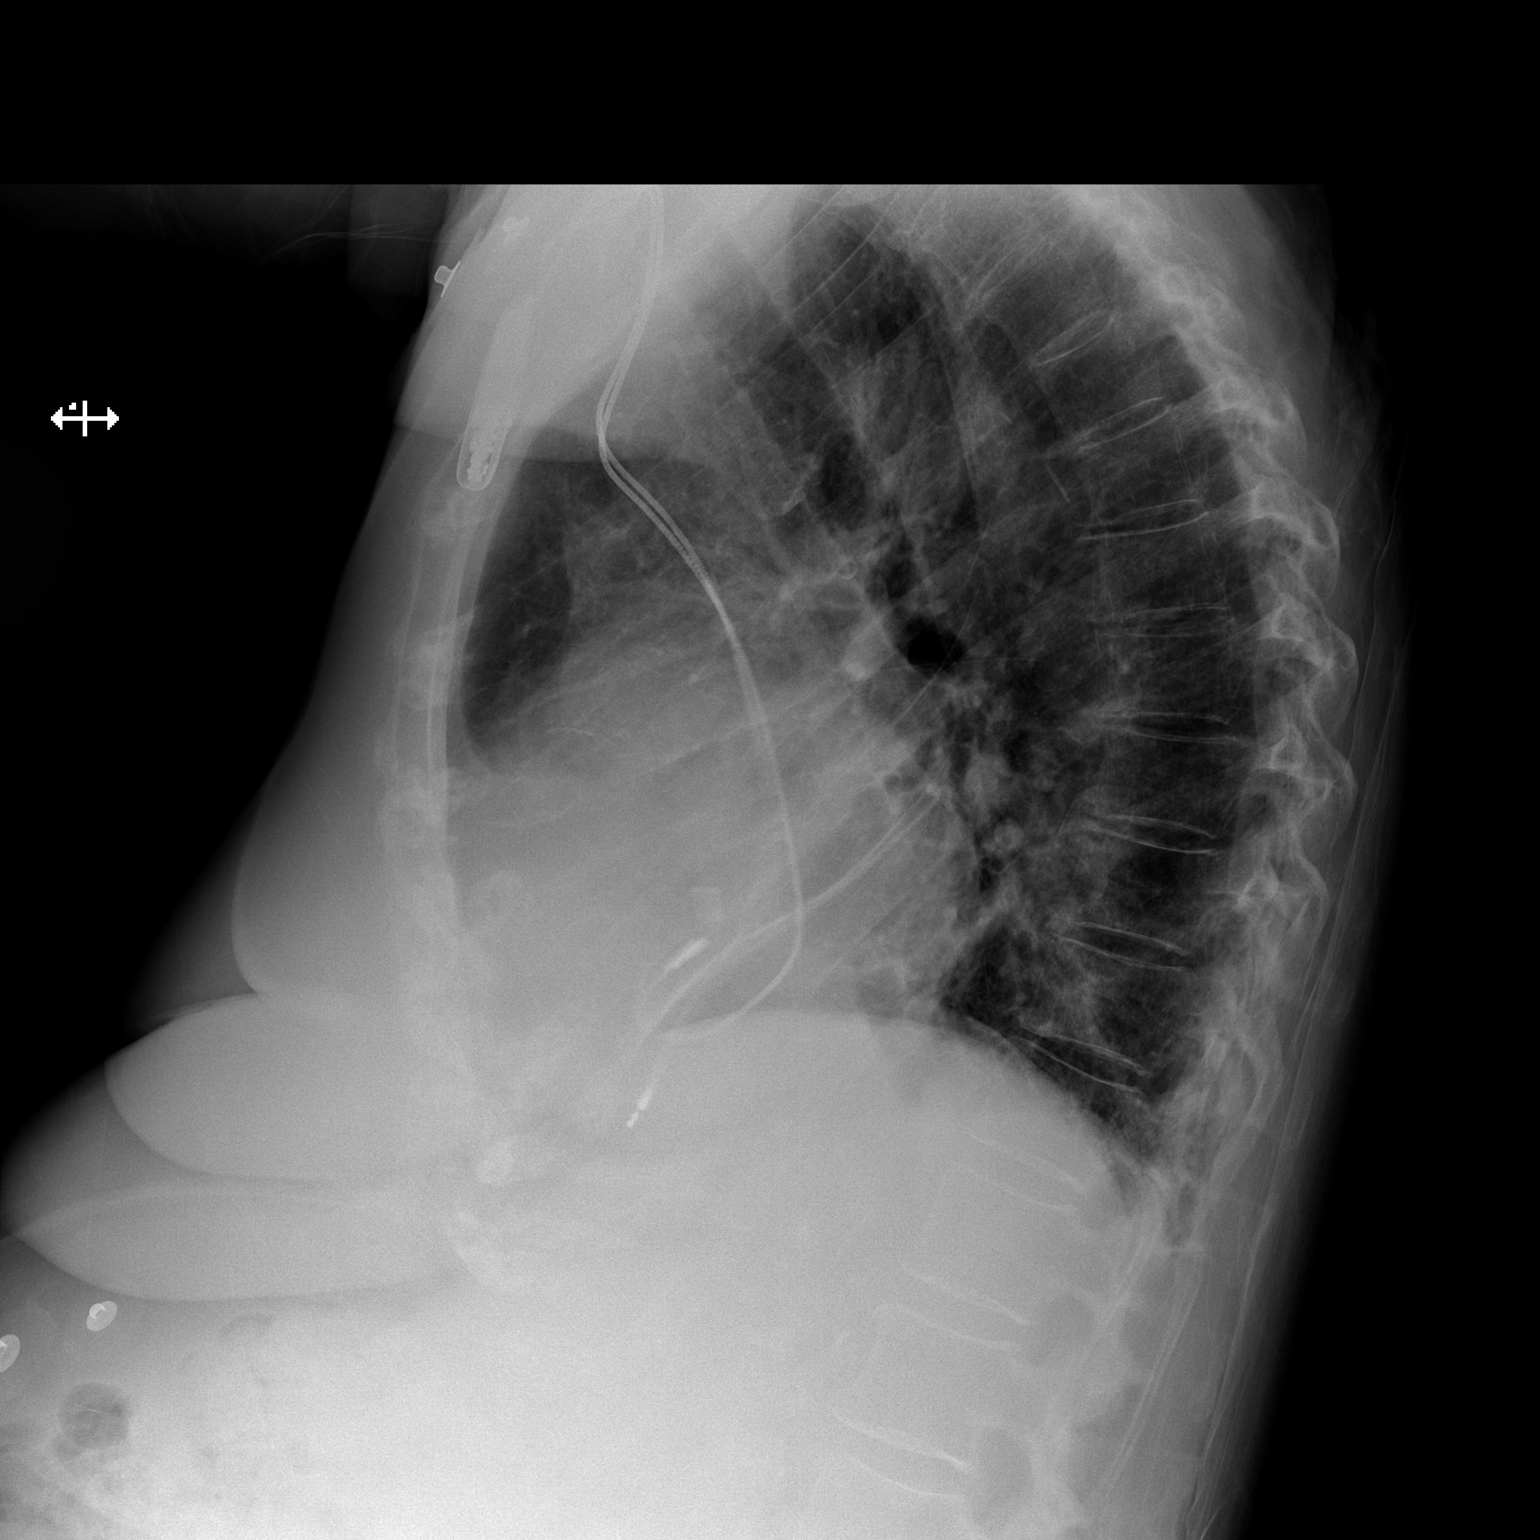

[2 of 2 positions shown; findings below may reference images not displayed]

IMPRESSION: And megaly. Cardiac pacer noted with tip in right atrium
right ventricle. Chest is stable from 10/07/2010.

## 2012-10-15 DIAGNOSIS — I5022 Chronic systolic (congestive) heart failure: Secondary | ICD-10-CM

## 2012-10-15 DIAGNOSIS — F068 Other specified mental disorders due to known physiological condition: Secondary | ICD-10-CM

## 2012-10-15 DIAGNOSIS — M1710 Unilateral primary osteoarthritis, unspecified knee: Secondary | ICD-10-CM

## 2012-10-15 DIAGNOSIS — E1149 Type 2 diabetes mellitus with other diabetic neurological complication: Secondary | ICD-10-CM

## 2012-12-26 DIAGNOSIS — G309 Alzheimer's disease, unspecified: Secondary | ICD-10-CM

## 2012-12-26 DIAGNOSIS — M1710 Unilateral primary osteoarthritis, unspecified knee: Secondary | ICD-10-CM

## 2012-12-26 DIAGNOSIS — I5022 Chronic systolic (congestive) heart failure: Secondary | ICD-10-CM

## 2012-12-26 DIAGNOSIS — F0280 Dementia in other diseases classified elsewhere without behavioral disturbance: Secondary | ICD-10-CM

## 2012-12-26 DIAGNOSIS — E1149 Type 2 diabetes mellitus with other diabetic neurological complication: Secondary | ICD-10-CM

## 2013-02-20 DIAGNOSIS — I5022 Chronic systolic (congestive) heart failure: Secondary | ICD-10-CM

## 2013-02-20 DIAGNOSIS — G309 Alzheimer's disease, unspecified: Secondary | ICD-10-CM

## 2013-02-20 DIAGNOSIS — F0280 Dementia in other diseases classified elsewhere without behavioral disturbance: Secondary | ICD-10-CM

## 2013-02-20 DIAGNOSIS — E1149 Type 2 diabetes mellitus with other diabetic neurological complication: Secondary | ICD-10-CM

## 2013-02-20 DIAGNOSIS — M1710 Unilateral primary osteoarthritis, unspecified knee: Secondary | ICD-10-CM

## 2013-02-27 DIAGNOSIS — Q828 Other specified congenital malformations of skin: Secondary | ICD-10-CM

## 2013-04-09 DIAGNOSIS — E1149 Type 2 diabetes mellitus with other diabetic neurological complication: Secondary | ICD-10-CM

## 2013-04-09 DIAGNOSIS — M1990 Unspecified osteoarthritis, unspecified site: Secondary | ICD-10-CM

## 2013-04-09 DIAGNOSIS — I509 Heart failure, unspecified: Secondary | ICD-10-CM

## 2013-04-09 DIAGNOSIS — H179 Unspecified corneal scar and opacity: Secondary | ICD-10-CM

## 2013-06-16 DIAGNOSIS — C4441 Basal cell carcinoma of skin of scalp and neck: Secondary | ICD-10-CM

## 2013-08-07 ENCOUNTER — Telehealth: Payer: Self-pay | Admitting: *Deleted

## 2013-08-07 NOTE — Telephone Encounter (Signed)
Received a prior auth denial letter from [facility] on Lidocaine patches. Auth was initiated by nursing home pharmacy. Will place letter in your inbox to be signed then scanned.

## 2013-08-09 NOTE — Telephone Encounter (Signed)
Please send it to [facility], no pharmacy will allow [place] to prescribe lidoderm Please ask JJ about the specific prescribing indications -- this may be why they are being refused

## 2013-08-12 NOTE — Telephone Encounter (Signed)
Faxed to [facility]

## 2013-09-26 DIAGNOSIS — L02619 Cutaneous abscess of unspecified foot: Secondary | ICD-10-CM

## 2013-09-26 DIAGNOSIS — L03039 Cellulitis of unspecified toe: Secondary | ICD-10-CM

## 2013-10-07 DIAGNOSIS — M60009 Infective myositis, unspecified site: Secondary | ICD-10-CM

## 2013-10-07 DIAGNOSIS — M1710 Unilateral primary osteoarthritis, unspecified knee: Secondary | ICD-10-CM

## 2013-10-07 DIAGNOSIS — E1165 Type 2 diabetes mellitus with hyperglycemia: Secondary | ICD-10-CM

## 2013-10-07 DIAGNOSIS — F0280 Dementia in other diseases classified elsewhere without behavioral disturbance: Secondary | ICD-10-CM

## 2013-10-07 DIAGNOSIS — G309 Alzheimer's disease, unspecified: Secondary | ICD-10-CM

## 2013-10-07 DIAGNOSIS — IMO0002 Reserved for concepts with insufficient information to code with codable children: Secondary | ICD-10-CM

## 2013-10-07 DIAGNOSIS — IMO0001 Reserved for inherently not codable concepts without codable children: Secondary | ICD-10-CM

## 2013-10-09 ENCOUNTER — Ambulatory Visit (INDEPENDENT_AMBULATORY_CARE_PROVIDER_SITE_OTHER): Payer: Medicare Other | Admitting: Podiatrist

## 2013-10-09 ENCOUNTER — Encounter: Payer: Self-pay | Admitting: Podiatrist

## 2013-10-09 VITALS — Resp 16 | Ht 64.0 in | Wt 160.0 lb

## 2013-10-09 DIAGNOSIS — L97509 Non-pressure chronic ulcer of other part of unspecified foot with unspecified severity: Secondary | ICD-10-CM

## 2013-10-09 DIAGNOSIS — M79609 Pain in unspecified limb: Secondary | ICD-10-CM

## 2013-10-09 DIAGNOSIS — B351 Tinea unguium: Secondary | ICD-10-CM

## 2013-10-09 MED ORDER — SILVER SULFADIAZINE 1 % EX CREA
1.0000 | TOPICAL_CREAM | Freq: Every day | CUTANEOUS | Status: DC
Start: 2013-10-09 — End: 2013-12-23

## 2013-10-09 NOTE — Patient Instructions (Signed)
Instructions hand written

## 2013-10-14 NOTE — Progress Notes (Signed)
Chief Complaint  Patient presents with  . Toe Pain    i got a toe problem-great toe left     HPI: Patient is 80 y.o. female who presents today for left great toe pain. She has been seen by me in the past and relates the toe pain recently started. She's not sure how long she's had a wound present. The patient also has symptomatic mycotic toenails.     Physical Exam  neurovascular status is unchanged. Pedal pulses palpable at 1/4 DP and PT bilateral. Neurological sensation decreased via Semmes Weinstein monofilament at 3/5 sites bilateral. Toenails are elongated, thickened, dystrophic and mycotic. She has an ulceration on the left hallux which appears to be stable. There is a red granular base noted no malodor, no streaking, no localized infection appears to be present.  Assessment: Ulceration left hallux, symptomatic mycotic toenails Plan: Debrided the ulceration in the toenails without complication applied Iodosorb and a dry sterile compressive dressing to the ulceration and gave instructions for wound care for the toe. I will see her back for followup of the ulceration if she notices any redness, swelling, pain or changes to the ulceration she is to call immediately.

## 2013-11-04 DIAGNOSIS — R1013 Epigastric pain: Secondary | ICD-10-CM

## 2013-12-08 DIAGNOSIS — G309 Alzheimer's disease, unspecified: Secondary | ICD-10-CM

## 2013-12-08 DIAGNOSIS — I5022 Chronic systolic (congestive) heart failure: Secondary | ICD-10-CM

## 2013-12-08 DIAGNOSIS — M159 Polyosteoarthritis, unspecified: Secondary | ICD-10-CM

## 2013-12-08 DIAGNOSIS — N183 Chronic kidney disease, stage 3 unspecified: Secondary | ICD-10-CM

## 2013-12-08 DIAGNOSIS — E1129 Type 2 diabetes mellitus with other diabetic kidney complication: Secondary | ICD-10-CM

## 2013-12-08 DIAGNOSIS — E1142 Type 2 diabetes mellitus with diabetic polyneuropathy: Secondary | ICD-10-CM

## 2013-12-08 DIAGNOSIS — F0280 Dementia in other diseases classified elsewhere without behavioral disturbance: Secondary | ICD-10-CM

## 2013-12-08 DIAGNOSIS — E1149 Type 2 diabetes mellitus with other diabetic neurological complication: Secondary | ICD-10-CM

## 2013-12-22 DIAGNOSIS — R1011 Right upper quadrant pain: Secondary | ICD-10-CM

## 2013-12-23 ENCOUNTER — Encounter: Payer: Self-pay | Admitting: General Surgery

## 2013-12-23 ENCOUNTER — Ambulatory Visit (INDEPENDENT_AMBULATORY_CARE_PROVIDER_SITE_OTHER): Payer: Medicare Other | Admitting: General Surgery

## 2013-12-23 ENCOUNTER — Inpatient Hospital Stay: Payer: Self-pay | Admitting: General Surgery

## 2013-12-23 VITALS — BP 130/74 | HR 66 | Temp 98.6°F | Resp 14 | Ht 64.0 in | Wt 150.0 lb

## 2013-12-23 DIAGNOSIS — K810 Acute cholecystitis: Secondary | ICD-10-CM

## 2013-12-23 LAB — CBC WITH DIFFERENTIAL/PLATELET
Basophil #: 0 10*3/uL (ref 0.0–0.1)
Basophil %: 0.2 %
Eosinophil #: 0.1 10*3/uL (ref 0.0–0.7)
Eosinophil %: 0.5 %
HCT: 39.2 % (ref 35.0–47.0)
HGB: 12.8 g/dL (ref 12.0–16.0)
LYMPHS PCT: 6.1 %
Lymphocyte #: 0.8 10*3/uL — ABNORMAL LOW (ref 1.0–3.6)
MCH: 31.9 pg (ref 26.0–34.0)
MCHC: 32.7 g/dL (ref 32.0–36.0)
MCV: 98 fL (ref 80–100)
MONOS PCT: 5.4 %
Monocyte #: 0.7 x10 3/mm (ref 0.2–0.9)
Neutrophil #: 11.8 10*3/uL — ABNORMAL HIGH (ref 1.4–6.5)
Neutrophil %: 87.8 %
PLATELETS: 243 10*3/uL (ref 150–440)
RBC: 4.01 10*6/uL (ref 3.80–5.20)
RDW: 13.6 % (ref 11.5–14.5)
WBC: 13.4 10*3/uL — ABNORMAL HIGH (ref 3.6–11.0)

## 2013-12-23 LAB — COMPREHENSIVE METABOLIC PANEL
ALT: 15 U/L (ref 12–78)
AST: 17 U/L (ref 15–37)
Albumin: 2.5 g/dL — ABNORMAL LOW (ref 3.4–5.0)
Alkaline Phosphatase: 88 U/L
Anion Gap: 5 — ABNORMAL LOW (ref 7–16)
BUN: 45 mg/dL — AB (ref 7–18)
Bilirubin,Total: 0.4 mg/dL (ref 0.2–1.0)
CHLORIDE: 107 mmol/L (ref 98–107)
CREATININE: 1.6 mg/dL — AB (ref 0.60–1.30)
Calcium, Total: 9.2 mg/dL (ref 8.5–10.1)
Co2: 31 mmol/L (ref 21–32)
EGFR (Non-African Amer.): 28 — ABNORMAL LOW
GFR CALC AF AMER: 33 — AB
Glucose: 181 mg/dL — ABNORMAL HIGH (ref 65–99)
OSMOLALITY: 301 (ref 275–301)
Potassium: 4.1 mmol/L (ref 3.5–5.1)
SODIUM: 143 mmol/L (ref 136–145)
Total Protein: 6.9 g/dL (ref 6.4–8.2)

## 2013-12-23 LAB — LIPASE, BLOOD: LIPASE: 103 U/L (ref 73–393)

## 2013-12-23 NOTE — Progress Notes (Signed)
Patient ID: Jeanne Sheppard, female   DOB: May 21, 1923, 80 y.o.   MRN: [id number]  Chief Complaint  Patient presents with  . Other    New Pt evaluation of gallbladder    HPI Jeanne Sheppard is a 80 y.o. female.  Here today for evaluation of abdominal pain and gallbladder. States she is having abdominal pain for about a 4 days.  Denies N/V/D. [place] had attempted to give an enema thinking it may have been constipation and the pain in the right abdomen had started prior to that enema. Ultrasound done 12-22-13. Bowels move daily.  She can not recall it being associated with meals. She has CHF, shortness of breath, and has had her Lasix adjusted.  Here today with her daughter, patient has dementia and is on Aricept.  HPI  Past Medical History  Diagnosis Date  . Hypertension   . Diabetes mellitus     NIDDM  . Hyperlipidemia   . Arthritis   . CHF (congestive heart failure)     systolic  . Dementia   . Urinary incontinence   . Chronic kidney disease   . Restrictive lung disease     Past Surgical History  Procedure Laterality Date  . Pacemaker insertion  ~2008  . Abdominal hysterectomy    . Breast surgery      negative biopsy  . Cataract extraction      right first, left in 4/11  . Uti/gi problems  8/10    Hospitalized    Family History  Problem Relation Age of Onset  . Early death Neg Hx     Social History History  Substance Use Topics  . Smoking status: Never Smoker   . Smokeless tobacco: Never Used  . Alcohol Use: No     Comment: rare in the past    Allergies  Allergen Reactions  . Tramadol     Drowsy and hallucination  . Amoxicillin     Current Outpatient Prescriptions  Medication Sig Dispense Refill  . acetaminophen (TYLENOL) 650 MG CR tablet Take 325 mg by mouth every 6 (six) hours as needed. For back pain      . aspirin 81 MG EC tablet Take 81 mg by mouth daily.        . carvedilol (COREG) 3.125 MG tablet Take 3.125 mg by mouth 2 (two) times daily with  a meal.        . cefTRIAXone in lidocaine (with preservative) injection Inject 1,000 mg into the muscle once. 3 days      . donepezil (ARICEPT) 10 MG tablet Take 5 mg by mouth daily.       . furosemide (LASIX) 80 MG tablet Take 40 mg by mouth every other day.       . gabapentin (NEURONTIN) 300 MG capsule Take 300 mg by mouth daily. And 600mg  at bedtime      . glipiZIDE (GLUCOTROL XL) 5 MG 24 hr tablet Take 5 mg by mouth daily.      Marland Kitchen HYDROcodone-acetaminophen (NORCO/VICODIN) 5-325 MG per tablet Take 1 tablet by mouth as needed.      . hydrocortisone 2.5 % cream Apply 1 application topically 2 (two) times daily.      . polyethylene glycol (MIRALAX / GLYCOLAX) packet Take 17 g by mouth daily.         No current facility-administered medications for this visit.    Review of Systems Review of Systems  Constitutional: Negative.   Respiratory: Negative.   Cardiovascular:  Negative.   Gastrointestinal: Positive for abdominal pain. Negative for nausea, vomiting, diarrhea and blood in stool.    Blood pressure 130/74, pulse 66, temperature 98.6 F (37 C), temperature source Oral, resp. rate 14, height 5\' 4"  (1.626 m), weight 150 lb (68.04 kg).  Physical Exam Physical Exam  Eyes: Conjunctivae are normal. No scleral icterus.  Neck: Neck supple.  Cardiovascular: Normal rate.   Pulmonary/Chest: Effort normal and breath sounds normal.  Abdominal: Bowel sounds are normal. There is tenderness (RUQ tender to palpation; positive Percell Miller).  Marked tenderness right upper quadrant  Lymphadenopathy:    She has no cervical adenopathy.  Neurological: She is alert.  Pt is pleasantly confused.  Skin: Skin is warm and dry.    Data Reviewed Notes and ultrasound report reviewed.  Assessment    Pt has symptomm, finding of acute cholecystitis. US shows pericholecystic fluid, gbw thickening, sludge. No labs done. Pt has history of CHF.       Plan    Pt is best served by admission to hospital, check  labs, get IM consult. Likely will need lap/cholecystectomy or percutaneous drainage.  Discussed fully with pt and her daughter. They are agreeable.     PCP: Danley Danker 12/23/2013, 12:01 PM

## 2013-12-23 NOTE — Patient Instructions (Addendum)
The patient is aware to call   for any questions or concerns. She is to get  admitted to [facility], today.

## 2013-12-24 ENCOUNTER — Other Ambulatory Visit: Payer: Self-pay | Admitting: *Deleted

## 2013-12-24 DIAGNOSIS — K8000 Calculus of gallbladder with acute cholecystitis without obstruction: Secondary | ICD-10-CM

## 2013-12-24 LAB — CBC WITH DIFFERENTIAL/PLATELET
BASOS ABS: 0 10*3/uL (ref 0.0–0.1)
BASOS PCT: 0.3 %
EOS PCT: 0.8 %
Eosinophil #: 0.1 10*3/uL (ref 0.0–0.7)
HCT: 39.4 % (ref 35.0–47.0)
HGB: 12.8 g/dL (ref 12.0–16.0)
LYMPHS PCT: 9.8 %
Lymphocyte #: 1.1 10*3/uL (ref 1.0–3.6)
MCH: 32.1 pg (ref 26.0–34.0)
MCHC: 32.5 g/dL (ref 32.0–36.0)
MCV: 99 fL (ref 80–100)
Monocyte #: 0.7 x10 3/mm (ref 0.2–0.9)
Monocyte %: 6.8 %
Neutrophil #: 9.1 10*3/uL — ABNORMAL HIGH (ref 1.4–6.5)
Neutrophil %: 82.3 %
Platelet: 250 10*3/uL (ref 150–440)
RBC: 3.99 10*6/uL (ref 3.80–5.20)
RDW: 13.7 % (ref 11.5–14.5)
WBC: 11 10*3/uL (ref 3.6–11.0)

## 2013-12-24 LAB — BASIC METABOLIC PANEL
Anion Gap: 6 — ABNORMAL LOW (ref 7–16)
BUN: 39 mg/dL — ABNORMAL HIGH (ref 7–18)
CHLORIDE: 107 mmol/L (ref 98–107)
CREATININE: 1.22 mg/dL (ref 0.60–1.30)
Calcium, Total: 9.5 mg/dL (ref 8.5–10.1)
Co2: 32 mmol/L (ref 21–32)
EGFR (African American): 46 — ABNORMAL LOW
GFR CALC NON AF AMER: 40 — AB
Glucose: 102 mg/dL — ABNORMAL HIGH (ref 65–99)
Osmolality: 298 (ref 275–301)
Potassium: 4 mmol/L (ref 3.5–5.1)
Sodium: 145 mmol/L (ref 136–145)

## 2013-12-24 NOTE — Telephone Encounter (Signed)
Form on your desk  

## 2013-12-25 ENCOUNTER — Encounter: Payer: Self-pay | Admitting: General Surgery

## 2013-12-25 LAB — COMPREHENSIVE METABOLIC PANEL
ALT: 27 U/L (ref 12–78)
ANION GAP: 4 — AB (ref 7–16)
Albumin: 2.3 g/dL — ABNORMAL LOW (ref 3.4–5.0)
Alkaline Phosphatase: 73 U/L
BUN: 32 mg/dL — AB (ref 7–18)
Bilirubin,Total: 0.5 mg/dL (ref 0.2–1.0)
CALCIUM: 9.4 mg/dL (ref 8.5–10.1)
CHLORIDE: 109 mmol/L — AB (ref 98–107)
Co2: 31 mmol/L (ref 21–32)
Creatinine: 1.15 mg/dL (ref 0.60–1.30)
EGFR (African American): 49 — ABNORMAL LOW
EGFR (Non-African Amer.): 42 — ABNORMAL LOW
Glucose: 96 mg/dL (ref 65–99)
OSMOLALITY: 294 (ref 275–301)
Potassium: 3.9 mmol/L (ref 3.5–5.1)
SGOT(AST): 32 U/L (ref 15–37)
SODIUM: 144 mmol/L (ref 136–145)
Total Protein: 6.1 g/dL — ABNORMAL LOW (ref 6.4–8.2)

## 2013-12-25 LAB — PATHOLOGY REPORT

## 2013-12-25 LAB — CBC WITH DIFFERENTIAL/PLATELET
BASOS PCT: 0.1 %
Basophil #: 0 10*3/uL (ref 0.0–0.1)
EOS PCT: 0.5 %
Eosinophil #: 0.1 10*3/uL (ref 0.0–0.7)
HCT: 36.4 % (ref 35.0–47.0)
HGB: 11.9 g/dL — ABNORMAL LOW (ref 12.0–16.0)
Lymphocyte #: 0.8 10*3/uL — ABNORMAL LOW (ref 1.0–3.6)
Lymphocyte %: 8.1 %
MCH: 31.9 pg (ref 26.0–34.0)
MCHC: 32.6 g/dL (ref 32.0–36.0)
MCV: 98 fL (ref 80–100)
Monocyte #: 0.8 x10 3/mm (ref 0.2–0.9)
Monocyte %: 7.4 %
NEUTROS ABS: 8.7 10*3/uL — AB (ref 1.4–6.5)
Neutrophil %: 83.9 %
PLATELETS: 251 10*3/uL (ref 150–440)
RBC: 3.72 10*6/uL — AB (ref 3.80–5.20)
RDW: 13.4 % (ref 11.5–14.5)
WBC: 10.4 10*3/uL (ref 3.6–11.0)

## 2013-12-25 MED ORDER — HYDROCODONE-ACETAMINOPHEN 5-325 MG PO TABS: 0.5000 | ORAL_TABLET | Freq: Three times a day (TID) | ORAL | Status: AC

## 2013-12-25 NOTE — Telephone Encounter (Signed)
rx faxed to pharmacy manually  

## 2013-12-25 NOTE — Telephone Encounter (Signed)
Order signed Currently in [facility] after cholecystectomy

## 2013-12-29 ENCOUNTER — Encounter: Payer: Self-pay | Admitting: General Surgery

## 2013-12-29 DIAGNOSIS — K810 Acute cholecystitis: Secondary | ICD-10-CM

## 2013-12-30 LAB — WOUND CULTURE

## 2013-12-31 ENCOUNTER — Encounter: Payer: Self-pay | Admitting: General Surgery

## 2013-12-31 ENCOUNTER — Ambulatory Visit (INDEPENDENT_AMBULATORY_CARE_PROVIDER_SITE_OTHER): Payer: Self-pay | Admitting: General Surgery

## 2013-12-31 VITALS — BP 134/68 | HR 70 | Resp 16 | Ht 64.0 in | Wt 150.0 lb

## 2013-12-31 DIAGNOSIS — K810 Acute cholecystitis: Secondary | ICD-10-CM

## 2013-12-31 NOTE — Progress Notes (Signed)
This is a 80 year old female her today for her post opopen cholecystectomy done on 12/24/13. She had gangrenous cholecystitis.  Patient is doing much better. Lungs are clear and abdomen is soft and incision healing well.The  staples were removed and steri strips applied.No scleral icterus Patient to return as needed.

## 2014-01-01 ENCOUNTER — Encounter: Payer: Self-pay | Admitting: General Surgery

## 2014-01-05 DIAGNOSIS — R109 Unspecified abdominal pain: Secondary | ICD-10-CM

## 2014-02-13 DIAGNOSIS — M1710 Unilateral primary osteoarthritis, unspecified knee: Secondary | ICD-10-CM

## 2014-02-13 DIAGNOSIS — N183 Chronic kidney disease, stage 3 unspecified: Secondary | ICD-10-CM

## 2014-02-13 DIAGNOSIS — IMO0002 Reserved for concepts with insufficient information to code with codable children: Secondary | ICD-10-CM

## 2014-02-13 DIAGNOSIS — E1149 Type 2 diabetes mellitus with other diabetic neurological complication: Secondary | ICD-10-CM

## 2014-02-13 DIAGNOSIS — F0280 Dementia in other diseases classified elsewhere without behavioral disturbance: Secondary | ICD-10-CM

## 2014-02-13 DIAGNOSIS — I509 Heart failure, unspecified: Secondary | ICD-10-CM

## 2014-02-13 DIAGNOSIS — G309 Alzheimer's disease, unspecified: Secondary | ICD-10-CM

## 2014-02-13 DIAGNOSIS — E119 Type 2 diabetes mellitus without complications: Secondary | ICD-10-CM

## 2014-03-19 DIAGNOSIS — B354 Tinea corporis: Secondary | ICD-10-CM

## 2014-04-09 DIAGNOSIS — N183 Chronic kidney disease, stage 3 (moderate): Secondary | ICD-10-CM

## 2014-04-09 DIAGNOSIS — G301 Alzheimer's disease with late onset: Secondary | ICD-10-CM

## 2014-04-09 DIAGNOSIS — M150 Primary generalized (osteo)arthritis: Secondary | ICD-10-CM

## 2014-04-09 DIAGNOSIS — E1042 Type 1 diabetes mellitus with diabetic polyneuropathy: Secondary | ICD-10-CM

## 2014-05-04 ENCOUNTER — Encounter: Payer: Self-pay | Admitting: General Surgery

## 2014-06-12 DIAGNOSIS — H10029 Other mucopurulent conjunctivitis, unspecified eye: Secondary | ICD-10-CM

## 2014-06-12 DIAGNOSIS — N183 Chronic kidney disease, stage 3 (moderate): Secondary | ICD-10-CM

## 2014-06-12 DIAGNOSIS — M1990 Unspecified osteoarthritis, unspecified site: Secondary | ICD-10-CM

## 2014-06-12 DIAGNOSIS — G309 Alzheimer's disease, unspecified: Secondary | ICD-10-CM

## 2014-06-12 DIAGNOSIS — E1140 Type 2 diabetes mellitus with diabetic neuropathy, unspecified: Secondary | ICD-10-CM

## 2014-06-15 ENCOUNTER — Telehealth: Payer: Self-pay

## 2014-06-15 NOTE — Telephone Encounter (Signed)
PLEASE NOTE: All timestamps contained within this report are represented as [place] Standard Time. CONFIDENTIALTY NOTICE: This fax transmission is intended only for the addressee. It contains information that is legally privileged, confidential or otherwise protected from use or disclosure. If you are not the intended recipient, you are strictly prohibited from reviewing, disclosing, copying using or disseminating any of this information or taking any action in reliance on or regarding this information. If you have received this fax in error, please notify us immediately by telephone so that we can arrange for its return to [place]. Phone: [phone], Toll-Free: [phone], Fax: [phone] Page: 1 of 1 Call Id: [id number] [place] Patient Name: Jeanne Sheppard Gender: Female DOB: 03-03-1923 Age: 80 Y 11 M 13 D Return Phone Number: Address: City/State/Zip: [place] Client [place] Night - Client Client Site [employer] Physician Viviana Simpler Contact Type Call Call Type Page Only Caller Name Bonnita Nasuti Relationship To Patient Provider Is this call to report lab results? No Return Phone Number Unavailable Initial Comment Caller states from [facility] at [phone] and patient has expired. She is writing out an order to release the body. Did not need to talk to doctor. Nurse Assessment Guidelines Guideline Title Affirmed Question Affirmed Notes Nurse Date/Time (Eastern Time) Disp. Time Eilene Ghazi Time) Disposition Final User June 17, 2014 5:48:53 AM Send to [place], [place] Jun 17, 2014 5:56:50 AM Called On-Call Provider Gae Gallop 06/17/14 5:58:22 AM Page Completed Yes Gae Gallop After Care Instructions Given Call Event Type User Date / Time Description Comments User: Gae Gallop Date/Time Eilene Ghazi Time): 06/17/14 5:58:25  AM Paging DoctorName DoctorPhone DateTime Result/Outcome Notes Arnette Norris [phone] 06-17-14 5:56:50 AM Called On Call Provider - Samara Snide, Tanja Port June 17, 2014 5:57:01 AM Spoke with On Call - General

## 2014-07-03 DEATH — deceased

## 2014-10-24 NOTE — Op Note (Signed)
PATIENT NAME:  Jeanne Sheppard, Jeanne Sheppard MR#:  [id number] DATE OF BIRTH:  1922-08-31  DATE OF PROCEDURE:  12/24/2013  PREOPERATIVE DIAGNOSIS: Acute cholecystitis.  POSTOPERATIVE DIAGNOSIS:  Acute gangrenous cholecystitis with cholelithiasis.   OPERATION PERFORMED: Laparoscopy, converted to open cholecystectomy.   SURGEON: S G. Jamal Collin, M.D.   ANESTHESIA: General.   COMPLICATIONS: None.   ESTIMATED BLOOD LOSS: Approximately 50 to 75 mL.   DRAINS: Blake drain.   DESCRIPTION OF PROCEDURE: The patient was put to sleep in the supine position on the operating table. The abdomen was prepped and draped out as a sterile field. Near the umbilicus, a small port site incision was made and the Veress needle introduced into the peritoneal cavity, and verified with the hanging drop method. Pneumoperitoneum was obtained and a 10 mm port was placed. The camera was introduced with good visualization of the peritoneal cavity. There were adhesions of the small bowel, from previous surgery, located in the right upper quadrant just below the level of the gallbladder and it was apparent that the gallbladder was gangrenous. The portion visualized near the fundus was totally green in color. There appeared to be  loculation of some fluid which appeared to be bile-stained, anterior to the liver, and there were  phlegmon changes in the tissue surrounding the gallbladder. It was felt that it not suitable for a continued laparoscopic approach and, therefore, an open cholecystectomy was performed. Port was removed. A standard right subcostal incision was made and deepened through the layers down to the anterior sheath of the rectus, which was then cut with cautery, as also the rectus muscle, and the posterior sheath and peritoneum. The phlegmonous mass was then carefully exposed with retractors and adequate traction of the bowel away from the gallbladder. The entire gallbladder was visualized. It was gangrenous all the way down to the  Hartmann's pouch area and, from here, it appeared to have an acutely inflamed wall, thickening just surrounding the  cystic duct area. Accordingly, careful dissection was performed until the cystic duct and artery were identified;  cystic duct being extremely narrow. Both of these structures were ligated with 2-0 silk and a Hemoclip also placed and then cut. The gallbladder was then dissected off the gallbladder bed using cautery. There was a moderate amount of oozing from the gallbladder bed which was carefully controlled with cautery. One small area contained ooze which was controlled with a single figure-of-eight suture of 2-0 Vicryl. The anterior portion in front of the liver was then carefully explored, and all the fluid loculation from here was suctioned out. About a liter of fluid was used to irrigate out the right upper quadrant. The gallbladder bed was coated with 5 mL of Surgicel containing thrombin. A Blake drain was then positioned in the subhepatic space and brought out through a stab incision laterally and fastened to the skin with a nylon stitch. After ensuring hemostasis, and instrument counts and sponge counts reported correct, the abdomen was closed. Posterior sheath and peritoneum closed with a running 2-0 Vicryl stitch. The anterior sheath of the rectus closed with interrupted figure-of-eight stitches of 0 Maxon. The wound was irrigated in layers and this closure was done. Subcutaneous tissue was closed with 3-0 Vicryl and the skin approximated with staples. The port site incision also closed with staples. Dry sterile dressing was placed. The patient tolerated the procedure well and she was subsequently returned to the recovery room in stable condition and experienced no problems during the surgery or upon awakening.  ____________________________ S.Robinette Haines, MD sgs:aj D: 12/24/2013 18:14:05 ET T: 12/25/2013 06:12:00 ET JOB#: [id number]  cc: S.G. Jamal Collin, MD, <Dictator> [facility] Robinette Haines MD ELECTRONICALLY SIGNED 12/25/2013 6:44

## 2014-10-24 NOTE — Consult Note (Signed)
PATIENT NAME:  Jeanne Sheppard, Jeanne Sheppard MR#:  [id number] DATE OF BIRTH:  [birth year]  DATE OF CONSULTATION:  12/23/2013  REFERRING PHYSICIAN:  Dr.sankar CONSULTING PHYSICIAN:  Sital P. Benjie Karvonen, MD  PRIMARY CARE PHYSICIAN:  Dr. Silvio Pate.   REASON FOR REQUEST: Preoperative clearance for acute cholecystitis.   IMPRESSION:  1. Acute cholecystitis as per surgery.  2. History of congestive heart failure, ejection fraction of 25% to 35% by last echocardiogram. 3. Dementia.  4. Acute kidney disease.  5. Diabetes insipidus.  6. History of sick sinus syndrome, status post pacemaker.  7. History of diabetes.   PLAN:  1. Patient is moderate risk for moderate risk procedure may proceed with care to avoid pulmonary edema, CHF exacerbation, and postoperative delirium.  2. Please hold nephrotoxin agent.    3. Continue low-dose IV fluid for acute kidney injury. Monitoring I's and O's, daily weight, creatinine.  4. Please continue dementia medications.  5. Continue sliding scale insulin.   HISTORY OF PRESENT ILLNESS: This is an 81 year old  female with dementia, congestive heart failure, EF of 25% to 35%, history of restrictive lung disease, diabetes, sick sinus syndrome status post pacemaker who is admitted to the surgical service for acute cholecystitis. Hospitalist service is consulted for preoperative evaluation.   REVIEW OF SYSTEMS:  Unable to obtain a good review of systems due to dementia.   PAST MEDICAL HISTORY: According to medical chart: 1. History of chronic kidney disease stage II.  2. History of systolic congestive heart failure, EF of 25% to 35%.  The patient was seen by Dr. Ubaldo Glassing in the past.  3. History of restrictive lung disease.  4. History of diabetes.  5. Hypertension.  6. Sick sinus syndrome status post pacemaker.  7. History of gout.  8. Dementia.   MEDICATIONS:  Patient is on Coreg 3.125 p.o. b.i.d. and donepezil 5 mg at bedtime.   PAST SURGICAL HISTORY: As per the medical chart,  pacemaker placement.   SOCIAL HISTORY: No tobacco, alcohol or drug use as per medical chart. The patient is a resident at [facility].   FAMILY HISTORY: Unknown.   PHYSICAL EXAMINATION:  VITAL SIGNS: Temperature 98.2, pulse of 81, respirations18, blood pressure 146/60.  GENERAL: The patient is alert, not oriented to place or date. She is oriented to her name.  HEENT: Head is atraumatic. Pupils are round and reactive.  Sclerae anicteric.  Mucous membranes are moist. Oropharynx is clear.  NECK: Supple without JVD, carotid bruit, or enlarged thyroid.   CARDIOVASCULAR: Regular rate and rhythm. No murmurs, gallops, or rubs. PMI is laterally displaced.  LUNGS: Clear to auscultation without crackles, rubs, rhonchi, wheezing. Normal to percussion.  ABDOMEN: Bowel sounds are positive. Nontender, nondistended. No hepatosplenomegaly.  EXTREMITIES:  No clubbing, cyanosis, edema.  NEUROLOGIC: Cranial nerves II through XII are grossly intact. There are no focal deficits.  ABDOMEN: Bowel sounds are positive. She has tenderness at the right upper quadrant. No rebound, no guarding.  SKIN:  Without rash or lesions.   LABORATORY DATA: Sodium 143, potassium 4.1, chloride 107, bicarbonate 31, BUN 45, creatinine 1.60, glucose 181, total protein 6.9, albumin 2.5, bilirubin 0.4, alkaline phosphatase 88, ALT is 15, AST 17. White blood cells 13.4, hemoglobin 13, hematocrit 39.2, platelets of 243,000.   ASSESSMENT AND PLAN:  Thank you for allowing me to participate in the care of the patient.  We will continue to follow.   TIME SPENT: Approximately 45 minutes on consult.    ___________________________ Sital P. Benjie Karvonen, MD spm:dd D:  12/23/2013 14:33:16 ET T: 12/23/2013 18:54:41 ET JOB#: [id number]  cc: Sital P. Benjie Karvonen, MD, <Dictator> Venia Carbon, MD Unknown cc  Donell Beers MODY MD ELECTRONICALLY SIGNED 12/26/2013 13:57

## 2014-10-24 NOTE — Consult Note (Signed)
Amoxicillin: Unknown  Hepatic:  23-Jun-15 11:53   Bilirubin, Total 0.4  Alkaline Phosphatase 88 (45-117 NOTE: New Reference Range 05/23/13)  SGPT (ALT) 15  SGOT (AST) 17  Total Protein, Serum 6.9  Albumin, Serum  2.5  Routine Chem:  23-Jun-15 11:53   Glucose, Serum  181  BUN  45  Creatinine (comp)  1.60  Sodium, Serum 143  Potassium, Serum 4.1  Chloride, Serum 107  CO2, Serum 31  Calcium (Total), Serum 9.2  Osmolality (calc) 301  eGFR (African American)  33  eGFR (Non-African American)  28 (eGFR values <82m/min/1.73 m2 may be an indication of chronic kidney disease (CKD). Calculated eGFR is useful in patients with stable renal function. The eGFR calculation will not be reliable in acutely ill patients when serum creatinine is changing rapidly. It is not useful in  patients on dialysis. The eGFR calculation may not be applicable to patients at the low and high extremes of body sizes, pregnant women, and vegetarians.)  Anion Gap  5  Lipase 103 (Result(s) reported on 23 Dec 2013 at 12:25PM.)  Routine Hem:  23-Jun-15 11:53   WBC (CBC)  13.4  RBC (CBC) 4.01  Hemoglobin (CBC) 12.8  Hematocrit (CBC) 39.2  Platelet Count (CBC) 243  MCV 98  MCH 31.9  MCHC 32.7  RDW 13.6  Neutrophil % 87.8  Lymphocyte % 6.1  Monocyte % 5.4  Eosinophil % 0.5  Basophil % 0.2  Neutrophil #  11.8  Lymphocyte #  0.8  Monocyte # 0.7  Eosinophil # 0.1  Basophil # 0.0 (Result(s) reported on 23 Dec 2013 at 12:11PM.)    Impression 1. acute choley 2. hx CHF EF 25-35% 3. dementia 4. aki 5 dm 6. hx SSS s/p pacer 7. hx diabetes   Plan 1. pt mod risk for mod risk prodecure can proceed with care to avoid pulm edema/chf exacerbation .2. watch also for post op delirium  3. hold nephrotoxic agents 4. cont low dose IVF for AKI and moniotr i/o and creatinine 5. cont dementia meds 6. SSI  thank you will clsoely follow   Electronic Signatures: MBettey Costa(MD)  (Signed 23-Jun-15  14:29)  Authored: Allergies, Labs, Impression/Plan   Last Updated: 23-Jun-15 14:29 by MBettey Costa(MD)

## 2014-10-24 NOTE — Discharge Summary (Signed)
PATIENT NAME:  Jeanne Sheppard, Jeanne Sheppard MR#:  [id number] DATE OF BIRTH:  [birth year]  DATE OF ADMISSION:  12/23/2013 DATE OF DISCHARGE:  12/26/2013  HISTORY OF PRESENT ILLNESS: This is an 81 year old female who was seen in the office  on 12/23/2013. She lives in a nursing home and has dementia and was noted to have some abdominal pain. This was not clear-cut in the beginning, but after a few days of symptomatic treatment, it was apparent that she had right upper quadrant pain and tenderness. Evaluation in the office included an exam and review of an ultrasound that was done earlier. Ultrasound showed some sludge in pericholecystic fluid and gallbladder wall thickening with a distended gallbladder. The patient was noted to be acutely tender in the right upper quadrant, and diagnosis was made of acute cholecystitis.   PAST HISTORY:  In addition to her dementia, includes that of cardiomyopathy with associated reduction in the ejection fraction which has been estimated somewhere in the 25-40% range. She has a history of congestive failure, but has not had any needle vent in the recent past. She has had no significant other medical history.   PREVIOUS SURGERIES: Included a pacemaker placement, cataract, abdominal hysterectomy, and a breast biopsy.   COURSE IN THE HOSPITAL: The patient was admitted with a diagnosis of acute cholecystitis. Initial laboratory values were obtained showing a white count of 13,000. Liver functions and lipase were normal. However, the patient was moderately dehydrated with a BUN of 45-50 and a creatinine of 1.6. The patient was started on IV fluids and, in view of the history of congestive failure, she was monitored carefully and given only about 100 mL an hour. Medical consult from the hospitalist was obtained for a follow-up during the hospital stay. Next morning, the patient's temperature remained normal. Her white count was trending down and her BUN and creatinine were also trending down, and  she was making a decent amount of urine.  At this point with clearance from the internal medicine service, it was decided to proceed with a laparoscopy, possible open cholecystectomy. This was discussed in full with the patient and her daughter, who was the POA. At laparoscopy, it was evident that the patient had a markedly acute process with the entire visualize part of the gallbladder being gangrenous. Accordingly, an open cholecystectomy was performed. The patient's postoperative course was essentially uncomplicated. Her dehydration continued to improve. She was able to tolerate oral intake and, at time of discharge, the incision is clean and intact. The drainage has been serous sanguinous and minimal in the last 8-10 hours and was removed. Her abdomen is soft with good bowel sounds.  Her lungs are clear. She has had no untoward events following surgery. Pathology report shows findings consistent with acute gangrenous cholecystitis and cholelithiasis. A culture obtained from the gallbladder shows no evidence of bacterial growth.   FINAL DIAGNOSES:  1.  Acute cholecystitis with cholelithiasis.  2.  History of congestive failure. 3.  Diabetes, controlled with an oral agent.  OPERATION PERFORMED:  Laparoscopy with conversion to an open cholecystectomy.   ____________________________ S.Robinette Haines, MD sgs:ts D: 12/26/2013 14:05:04 ET T: 12/26/2013 14:34:00 ET JOB#: [id number]  cc: S.G. Jamal Collin, MD, <Dictator> Venia Carbon, MD [facility] Robinette Haines MD ELECTRONICALLY SIGNED 12/29/2013 16:21
